# Patient Record
Sex: Male | Born: 1960 | Race: White | Hispanic: No | Marital: Single
Health system: Southern US, Community
[De-identification: ages and names within clinical notes are randomized; demographics above are authoritative.]

## PROBLEM LIST (undated history)

## (undated) DIAGNOSIS — Z9889 Other specified postprocedural states: Secondary | ICD-10-CM

## (undated) DIAGNOSIS — T304 Corrosion of unspecified body region, unspecified degree: Secondary | ICD-10-CM

## (undated) DIAGNOSIS — I10 Essential (primary) hypertension: Secondary | ICD-10-CM

---

## 2003-10-11 ENCOUNTER — Emergency Department (HOSPITAL_COMMUNITY): Admission: EM | Admit: 2003-10-11 | Discharge: 2003-10-11 | Payer: Self-pay | Admitting: Emergency Medicine

## 2004-11-16 ENCOUNTER — Emergency Department (HOSPITAL_COMMUNITY): Admission: EM | Admit: 2004-11-16 | Discharge: 2004-11-16 | Payer: Self-pay | Admitting: Emergency Medicine

## 2006-06-15 ENCOUNTER — Emergency Department: Payer: Self-pay | Admitting: Emergency Medicine

## 2006-07-13 ENCOUNTER — Emergency Department (HOSPITAL_COMMUNITY): Admission: EM | Admit: 2006-07-13 | Discharge: 2006-07-13 | Payer: Self-pay | Admitting: Emergency Medicine

## 2006-12-12 ENCOUNTER — Ambulatory Visit (HOSPITAL_COMMUNITY): Admission: RE | Admit: 2006-12-12 | Discharge: 2006-12-13 | Payer: Self-pay | Admitting: Orthopaedic Surgery

## 2007-04-14 ENCOUNTER — Ambulatory Visit (HOSPITAL_COMMUNITY): Admission: RE | Admit: 2007-04-14 | Discharge: 2007-04-14 | Payer: Self-pay | Admitting: Orthopaedic Surgery

## 2007-08-06 ENCOUNTER — Emergency Department (HOSPITAL_COMMUNITY): Admission: EM | Admit: 2007-08-06 | Discharge: 2007-08-06 | Payer: Self-pay | Admitting: Emergency Medicine

## 2008-12-23 ENCOUNTER — Inpatient Hospital Stay (HOSPITAL_COMMUNITY): Admission: EM | Admit: 2008-12-23 | Discharge: 2008-12-25 | Payer: Self-pay | Admitting: Emergency Medicine

## 2011-03-29 LAB — DIFFERENTIAL
Basophils Absolute: 0.1 10*3/uL (ref 0.0–0.1)
Basophils Relative: 1 % (ref 0–1)
Eosinophils Absolute: 0.1 10*3/uL (ref 0.0–0.7)
Eosinophils Relative: 1 % (ref 0–5)
Lymphs Abs: 1.9 10*3/uL (ref 0.7–4.0)
Monocytes Absolute: 1.2 10*3/uL — ABNORMAL HIGH (ref 0.1–1.0)
Monocytes Relative: 7 % (ref 3–12)
Neutro Abs: 14.6 10*3/uL — ABNORMAL HIGH (ref 1.7–7.7)
Neutrophils Relative %: 82 % — ABNORMAL HIGH (ref 43–77)

## 2011-03-29 LAB — CULTURE, ROUTINE-ABSCESS

## 2011-03-29 LAB — ANAEROBIC CULTURE

## 2011-03-29 LAB — CBC
HCT: 41.4 % (ref 39.0–52.0)
MCHC: 33.5 g/dL (ref 30.0–36.0)
WBC: 17.9 10*3/uL — ABNORMAL HIGH (ref 4.0–10.5)

## 2011-03-29 LAB — CULTURE, BLOOD (ROUTINE X 2)
Culture: NO GROWTH
Culture: NO GROWTH

## 2011-03-29 LAB — POCT I-STAT, CHEM 8
Chloride: 104 mEq/L (ref 96–112)
Hemoglobin: 14.6 g/dL (ref 13.0–17.0)

## 2011-04-27 NOTE — Op Note (Signed)
NAMEMASSEY, RUHLAND                  ACCOUNT NO.:  [id number]   MEDICAL RECORD NO.:  [id number]          PATIENT TYPE:  INP   LOCATION:  5127                         FACILITY:  [facility]   PHYSICIAN:  Tennis Must Meyerdierks, M.D.DATE OF BIRTH:  05-Apr-1961   DATE OF PROCEDURE:  12/23/2008  DATE OF DISCHARGE:                               OPERATIVE REPORT   PREOPERATIVE DIAGNOSES:  Abscess of right long finger with flexor sheath  infection.   POSTOPERATIVE DIAGNOSES:  Abscess of right long finger with flexor  sheath infection with extensor sheath infection.   PROCEDURE:  Incision and drainage of right long finger, flexor and  extensor sheath.   SURGEON:  Lowell Bouton, MD   ANESTHESIA:  General.   OPERATIVE FINDINGS:  The patient had an abscess over the radial aspect  of the proximal phalanx of the right long finger.  There was ecchymosis  dorsally and there was thick purulent material, mainly involving the  extensor sheath.  The flexor sheath had increased fluid, but no gross  purulence.   PROCEDURE:  Under general anesthesia with a tourniquet on the right arm,  the right hand was prepped and draped in usual fashion and after  elevating the limb, the tourniquet was inflated to 250 mmHg.  A mid  lateral incision was made over the proximal phalanx of the right long  finger and carried down through the subcutaneous tissues.  Gross  purulent material was obtained and cultures were sent to the lab.  Blunt  dissection was carried to the extensor sheath and there was purulent  material all along the proximal phalanx.  This was debrided with a  rongeur.  The flexor sheath was then opened over proximal to the A3  pulley and there was increased fluid, but no gross purulent material.  The wound was copiously irrigated with saline.  Marcaine 0.5% digital  block was inserted for pain control.  The wound was packed open and  sterile dressings were applied.  The tourniquet was  released with good  circulation of the hand.  The patient was placed in a dorsal protective  splint.  He tolerated the procedure well and went to the recovery room  awake in stable and good condition.      Lowell Bouton, M.D.  Electronically Signed     EMM/MEDQ  D:  12/23/2008  T:  12/23/2008  Job:  [id number]

## 2011-04-30 NOTE — Op Note (Signed)
Henry Harris, Henry Harris                  ACCOUNT NO.:  [id number]   MEDICAL RECORD NO.:  [id number]          PATIENT TYPE:  OIB   LOCATION:  2550                         FACILITY:  [facility]   PHYSICIAN:  Mark C. Ophelia Charter, M.D.    DATE OF BIRTH:  1961-01-22   DATE OF PROCEDURE:  04/14/2007  DATE OF DISCHARGE:                               OPERATIVE REPORT   PREOPERATIVE DIAGNOSIS:  Nonunion, status post distal radius fracture  with previous osteotomy, bone grafting, volar plate and iliac crest  aspirate.   POSTOPERATIVE DIAGNOSIS:  Nonunion, status post distal radius fracture  with previous osteotomy, bone grafting, volar plate and iliac crest  aspirate.   PROCEDURE:  Left distal radius takedown, nonunion, right iliac crest  bone graft and OP-1 bone morphogenic protein with dorsal locking  compression plate.   This 50 year old male had an on-the-job injury with a distal radius  fracture.  Sought treatment late.  Healed with dorsal angulation and  underwent a corrective osteotomy with volar plating and bone-grafting.  Plate and screws have stayed in good position.  Unfortunately, with time  the bone graft has gradually dissolved and resorbed leaving an empty  space with only a small amount of remaining bone.  He is brought in at  this time for repeat grafting with the dorsal approach and plating.   SURGEON:  Mark C. Ophelia Charter, MD   ASSISTANT:  Maud Deed, PA-C.   ANESTHESIA:  General.   TOURNIQUET TIME:  1 hour 9 minutes.   PROCEDURE:  After shaving the wrist, preoperative Ancef prophylaxis,  standard prepping, time-out procedure was taken.  An S-shaped incision  was made over the dorsal wrist and the interval between the third and  fourth compartment was used for exposure of the distal radius.  Dissection started proximally and subperiosteally and extended distally,  exposing the distal aspect of the radius toward the ulnar aspect,  staying to the ulnar side of Lister tubercle.  The  baby Hohmann  retractors were placed.  The nonunion site was identified.  Soft fibrous  tissue was present and this was the curetted.  Cultures were obtained.  There was no evidence of infection.  Bone was cleaned meticulously with  serial curettes, checking intermittently under fluoroscopy to make sure  that progression had taken place all the way out to the opposite cortex.  A tiny curette off the hand set was used to penetrate the volar cortex  and was checked under fluoroscopy, confirming that it was up against the  plate.  Continued undermining toward the radial aspect was performed,  checking it intermittently until the nonunion site had been completely  cleaned out.  At this point the right iliac crest that had been also  prepped and had a Betadine Vi-Drape had an incision made/  fascia was  split in line with the fibers of the muscle.  Muscle was split with the  Cobb and a 12 mm plug was harvested, taking the dowel plug out and  placing a sponge.  Cancellous chips were taken out and mixed with some  OP-1 powder mixed  with some sterile injectable saline.  This was mixed  together so that it would stick the patient's own bone and bone graft  was packed meticulously into the dry field that had had some holes  drilled in the endplate on each end for bleeding surface.  Bone was  meticulously placed.  Care was taken to make sure it stayed in the  nonunion site without spillage.  It was gradually packed until the site  was completely full.  A Synthes dorsal anatomical locking plate, which  was an L-shaped plate, was then placed.  Two distal screws were placed,  locking, as well as a proximal screw, locking, and checked.  Distal  screws were 18 mm, all the rest of the screws were 20 mm.  One screw  hole was left empty since it was at the osteotomy site where bone graft  had been packed and would not allow purchase.  Tourniquet was deflated  and the dorsal retinaculum was meticulously  repaired.  EPL tendon was  still active, pulled on, and the thumb was moved to make sure it still  slid nicely around Lister tubercle and there was no adherence.  Extensor  tendons to the fingers were good position.  Subcutaneous tissue was  repaired with 2-0 Vicryl, 3-0 Vicryl and then a 4-0 Vicryl subcuticular  closure, tincture of Benzoin, Steri-Strips, Marcaine infiltration,  Marcaine placed in the hip iliac crest wound.  Iliac crest closed with 0  Vicryl, 2-0 Vicryl and skin subcuticular closure.  A postop dressing was  applied.  Arm was replaced back into his wrist splint after tweeners,  4x4s, Webril and Ace wrap.  Instrument count and needle count was  correct.      Mark C. Ophelia Charter, M.D.  Electronically Signed     MCY/MEDQ  D:  04/14/2007  T:  04/14/2007  Job:  [id number]
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
## 2014-06-06 ENCOUNTER — Emergency Department (HOSPITAL_COMMUNITY)
Admission: EM | Admit: 2014-06-06 | Discharge: 2014-06-06 | Disposition: A | Discharge status: Home / Self Care | Payer: Self-pay | Attending: Emergency Medicine | Admitting: Emergency Medicine

## 2014-06-06 ENCOUNTER — Encounter (HOSPITAL_COMMUNITY): Payer: Self-pay | Admitting: Emergency Medicine

## 2014-06-06 DIAGNOSIS — H11421 Conjunctival edema, right eye: Secondary | ICD-10-CM

## 2014-06-06 DIAGNOSIS — Z87828 Personal history of other (healed) physical injury and trauma: Secondary | ICD-10-CM | POA: Insufficient documentation

## 2014-06-06 DIAGNOSIS — H579 Unspecified disorder of eye and adnexa: Secondary | ICD-10-CM

## 2014-06-06 DIAGNOSIS — H11429 Conjunctival edema, unspecified eye: Secondary | ICD-10-CM | POA: Insufficient documentation

## 2014-06-06 DIAGNOSIS — F17200 Nicotine dependence, unspecified, uncomplicated: Secondary | ICD-10-CM | POA: Insufficient documentation

## 2014-06-06 DIAGNOSIS — Z9889 Other specified postprocedural states: Secondary | ICD-10-CM | POA: Insufficient documentation

## 2014-06-06 HISTORY — DX: Other specified postprocedural states: Z98.890

## 2014-06-06 HISTORY — DX: Corrosion of unspecified body region, unspecified degree: T30.4

## 2014-06-06 MED ORDER — ERYTHROMYCIN 5 MG/GM OP OINT: TOPICAL_OINTMENT | OPHTHALMIC | Status: DC

## 2014-06-06 MED ORDER — TETRACAINE HCL 0.5 % OP SOLN
2.0000 [drp] | Freq: Once | OPHTHALMIC | Status: AC
  Administered 2014-06-06: 2 [drp] via OPHTHALMIC
  Filled 2014-06-06: qty 2

## 2014-06-06 MED ORDER — ERYTHROMYCIN 5 MG/GM OP OINT
TOPICAL_OINTMENT | Freq: Once | OPHTHALMIC | Status: AC
  Administered 2014-06-06: 21:00:00 via OPHTHALMIC
  Filled 2014-06-06: qty 1

## 2014-06-06 MED ORDER — FLUORESCEIN SODIUM 1 MG OP STRP
1.0000 | ORAL_STRIP | Freq: Once | OPHTHALMIC | Status: AC
  Administered 2014-06-06: 1 via OPHTHALMIC
  Filled 2014-06-06: qty 1

## 2014-06-06 NOTE — ED Notes (Signed)
Morgan Lens placed in pts rt eye and irrigated.
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
## 2014-06-06 NOTE — ED Notes (Signed)
Declined W/C at D/C and was escorted to lobby by RN. 

## 2014-06-06 NOTE — Discharge Instructions (Signed)
Please call and set up an appointment with ophthalmology tomorrow, Dr. Karleen HampshireSpencer Please use antibiotics as prescribed Please apply cool compressions to the eye Please wear sunglasses for I coverage and protection Please continue to monitor symptoms closely and if symptoms are to worsen or change (fever greater than 101, chills, swelling, numbness, tingling, neck pain, neck stiffness, sudden loss of vision to the eye, worsening or changes to pain symptoms) please report back to the ED immediately  Eye, Foreign Body The term foreign body refers to any object near, on the surface of or in the eye that should not be there. A foreign body may be a small speck of dirt or dust, a hair or eyelash, a splinter or any object. CAUSES  Foreign bodies can get in the eye by:  Flying pieces of something that was broken or destroyed (debris).  A sudden injury (trauma) to the eye. SYMPTOMS  Symptoms depend on what the foreign body is and where it is in the eye. The most common locations are:  On the inner surface of the upper or lower eyelids or on the covering of the white part of the eye (conjunctiva). Symptoms in this location are:  Irritating and painful, especially when blinking.  Feeling like something is in the eye.  On the surface of the clear covering on the front of the eye (cornea). A corneal foreign body has symptoms that:  Are painful and irritating since the cornea is very sensitive.  Form small "rust rings" around a metallic foreign body. Metallic foreign bodies stick more firmly to the surface of the cornea.  Inside the eyeball. Infection can happen fast and can be hard to treat with antibiotics. This is an extremely dangerous situation. Foreign bodies inside the eye can threaten vision. A person may even loose their eye. Foreign bodies inside the eye may cause:  Great pain.  Immediate loss of vision. DIAGNOSIS  Foreign bodies are found during an exam by an eye specialist. Those that  are on the eyelids, conjunctiva or cornea are usually (but not always) easily found. When a foreign body is inside the eyeball, a cataract may form almost right away. This makes it hard for an ophthalmologist to find the foreign body. Special tests may be needed, including ultrasound testing, X-rays and CT scans. TREATMENT   Foreign bodies that are on the eyelids, conjunctiva or cornea are often removed easily and painlessly.  If the foreign body has caused a scratch or abrasion of the cornea, antibiotic drops, ointments and/or a tight patch called a "pressure patch" may be needed. Follow-up exams will be needed for several days until the abrasion heals.  Surgery is needed right away if the foreign body is inside the eyeball. This is a medical emergency. An antibiotic therapy will likely be given to stop an infection. HOME CARE INSTRUCTIONS  The use of eye patches is not universal. Their use varies from state to state and from caregiver to caregiver. If an eye patch was applied:  Keep the eye patch on for as long as directed by your caregiver until the follow-up appointment.  Do not remove the patch to put in medications unless instructed to do so. When replacing the patch, retape it as it was before. Follow the same procedure if the patch becomes loose.  WARNING: Do not drive or operate machinery while the eye is patched. The ability to judge distances will be impaired.  Only take over-the-counter or prescription medicines for pain, discomfort or fever as  directed by the caregiver. If no eye patch was applied:  Keep the eye closed as much as possible. Do not rub the eye.  Wear dark glasses as needed to protect the eyes from bright light.  Do not wear contact lenses until the eye feels normal again, or as instructed.  Wear protective eye covering if there is a risk of eye injury. This is important when working with high speed tools.  Only take over-the-counter or prescription medicines  for pain, discomfort or fever as directed by the caregiver. SEEK IMMEDIATE MEDICAL CARE IF:   Pain increases in the eye or the vision changes.  You or your child has problems with the eye patch.  The injury to the eye appears to be getting larger.  There is discharge from the injured eye.  Swelling and/or soreness (inflammation) develops around the affected eye.  You or your child has an oral temperature above 102 F (38.9 C), not controlled by medicine.  Your baby is older than 3 months with a rectal temperature of 102 F (38.9 C) or higher.  Your baby is 27 months old or younger with a rectal temperature of 100.4 F (38 C) or higher. MAKE SURE YOU:   Understand these instructions.  Will watch your condition.  Will get help right away if you are not doing well or get worse. Document Released: 11/29/2005 Document Revised: 02/21/2012 Document Reviewed: 04/26/2013 [facility] Patient Information 2015 [place], [place]. This information is not intended to replace advice given to you by your health care provider. Make sure you discuss any questions you have with your health care provider.   Emergency Department Resource Guide 1) Find a Doctor and Pay Out of Pocket Although you won't have to find out who is covered by your insurance plan, it is a good idea to ask around and get recommendations. You will then need to call the office and see if the doctor you have chosen will accept you as a new patient and what types of options they offer for patients who are self-pay. Some doctors offer discounts or will set up payment plans for their patients who do not have insurance, but you will need to ask so you aren't surprised when you get to your appointment.  2) Contact Your Local Health Department Not all health departments have doctors that can see patients for sick visits, but many do, so it is worth a call to see if yours does. If you don't know where your local health department is, you can  check in your phone book. The CDC also has a tool to help you locate your state's health department, and many state websites also have listings of all of their local health departments.  3) Find a Walk-in Clinic If your illness is not likely to be very severe or complicated, you may want to try a walk in clinic. These are popping up all over the country in pharmacies, drugstores, and shopping centers. They're usually staffed by nurse practitioners or physician assistants that have been trained to treat common illnesses and complaints. They're usually fairly quick and inexpensive. However, if you have serious medical issues or chronic medical problems, these are probably not your best option.  No Primary Care Doctor: - Call Health Connect at  [phone] - they can help you locate a primary care doctor that  accepts your insurance, provides certain services, etc. - Physician Referral Service- [phone]  Chronic Pain Problems: Organization         Address  Phone   Notes  [facility]  [phone]) [phone] Patients need to be referred by their primary care doctor.   Medication Assistance: Organization         Address  Phone   Notes  [facility] Medication [facility] [address] [place], [place] [address] [phone]) [phone] --Must be a resident of [facility] [place] -- Must have NO insurance coverage whatsoever (no Medicaid/ Medicare, etc.) -- The pt. MUST have a primary care doctor that directs their care regularly and follows them in the community   MedAssist  [phone]   [employer]  [phone]) [phone]    Agencies that provide inexpensive medical care: Organization         Address  Phone   Notes  [facility]  [phone]) [phone]   [facility]    [phone]) [phone]   [facility] [address] [place], [place] [address] [phone]) [phone]   [facility] [address], [place] [phone]) [phone]    [facility]anned Parenthood    [phone](336) [phone]   [facility]    [phone]   [facility]  [address], [place] Phone:  [phone], Fax:  [phone] Hours of Operation:  9 am - 6 pm, M-F.  Also accepts Medicaid/Medicare and self-pay.  [facility]  [address], [place] Phone: [phone]) [phone], Fax: [phone]. Hours of Operation:  8:30 am - 5:30 pm, M-F.  Also accepts Medicaid and self-pay.  [facility] [place] [address], [place] Phone: [phone]) [phone]   [facility] [address], [place], [place] [phone], Ext. [phone] Mondays & Thursdays: 7-9 AM.  First 15 patients are seen on a first come, first serve basis.    Medicaid-accepting [facility] Providers:  Organization         Address  Phone   Notes  [facility] [address], [place] [phone] Also accepts self-pay patients.  [facility] [address], [place]  [phone]) [phone]   [facility] [address], [place] [phone]) [phone]   [facility] [address] [place] [address], [place] [phone]) [phone]   Renaye RakersVeita Bland [address], [place]   [phone]) [phone] Only accepts [place] Access [place] patients after they have their name applied to their card.   Self-Pay (no insurance) in [facility]:  Organization         Address  Phone   Notes  Sickle Cell Patients, [facility] [address], [place] [phone]) [phone]   [facility] [address], [place] [phone]) [phone]   [facility] [place]  [address],  [phone]) [phone]   [facility]/Dr. Osei-Bonsu  [address] [place] [address], [place] or [phone], [place] [phone] Phone number for both [place] and [place] locations is the  same.  [facility] [address], [place] [phone](336) [phone]   [facility] [place] [address] [place] [address], [place] or [address] [phone]) [phone]   [facility] [address], [place] [phone], phone; [phone]) [phone], fax Sees patients 1st and 3rd Saturday of every month.  Must not qualify for public  or private insurance (i.e. Medicaid, Medicare, [place] Health Choice, Veterans' Benefits)  Household income should be no more than 200% of the poverty level The clinic cannot treat you if you are pregnant or think you are pregnant  Sexually transmitted diseases are not treated at the clinic.    Dental Care: Organization         Address  Phone  Notes  [facility] of [facility] [address] [place], [place] [phone] Accepts children up to age 66 who are enrolled in [place] or [place] Health Choice; pregnant women with a Medicaid card; and children who have applied for Medicaid or [place] Health Choice, but were declined, whose parents can pay a reduced fee at time of service.  [facility] of [facility]  [address], [place] [phone] Accepts children up to age 49 who are enrolled in [place] or [place] Health Choice; pregnant women with a Medicaid card; and children who have applied for Medicaid or [place] Health Choice, but were declined, whose parents can pay a reduced fee at time of service.  [facility]  [address] [place], [place] [phone] Patients are seen by appointment only. Walk-ins are not accepted. [facility] will see patients 3 years of age and older. Monday - Tuesday (8am-5pm) Most Wednesdays (8:30-5pm) $30 per visit, cash only  [facility] Adult Dental Access PROGRAM  [address], [facility] [phone] Patients are seen by appointment only. Walk-ins are not accepted. [facility] will see patients  45 years of age and older. One Wednesday Evening (Monthly: Volunteer Based).  $30 per visit, cash only  [employer]  [phone] for adults; Children under age 59, call Graduate Pediatric Dentistry at [phone]. Children aged 8-14, please call [phone] to request a pediatric application.  Dental services are provided in all areas of dental care including fillings, crowns and bridges, complete and partial dentures, implants, gum treatment, root canals, and extractions. Preventive care is also provided. Treatment is provided to both adults and children. Patients are selected via a lottery and there is often a waiting list.   [facility] [address], [place]  [phone] [email or web address]   [facility] [address] [place], [place] [phone], Ext. [phone] Second and Fourth Thursday of each month, opens at 6:30 AM; Clinic ends at 9 AM.  Patients are seen on a first-come first-served basis, and a limited number are seen during each clinic.   [facility]  [address] [place], [place] [phone]   Eligibility Requirements You must have lived in [place], [place], or [place] counties for at least the last three months.   You cannot be eligible for state or federal sponsored [place], including [employer], [place], or [employer].   You generally cannot be eligible for healthcare insurance through your employer.    How to apply: Eligibility screenings are held every Tuesday and Wednesday afternoon from 1:00 pm until 4:00 pm. You do not need an appointment for the interview!  [facility] [address], [place], [place] [phone]   [facility] Health Department  [phone]   [facility] Health Department  [phone]   [facility] Health Department  [phone]    Behavioral Health Resources in the Community: Intensive Outpatient  Programs Organization         Address  Phone  Notes  [facility] [address], [place], [place] [phone]   [facility] Outpatient [address] [place], [place] [phone]   [facility] [address], [place], [place]  [phone]   [facility] [address]  [place], [place] [phone] or [phone]   Substance Abuse Resources Organization         Address  Phone  Notes  Al[facility] Drug Services  [phone]   [facility]  [phone]   [facility] [place]  [phone]   Floydene Flock  [phone]   Residential & Outpatient Substance Abuse Program  [phone]   Psychological Services Organization         Address  Phone  Notes  [facility]  [phone]   [facility] Services  [phone]   [facility] Mental Health [address] [place] [phone] or [phone]    Mobile Crisis Teams Organization         Address  Phone  Notes  [facility], Mobile Crisis Care Unit  [phone]   [facility]  [address] [place], [place] [phone]   Doristine Locks [address] [place] [phone]    Self-Help/Support Groups Organization         Address  Phone             Notes  [facility] [place] - variety of support groups  336- I7437963 Call for more information  Narcotics Anonymous (NA), Caring [facility] [address], [employer] [place]  2 meetings at this location   Statistician         Address  Phone  Notes  [facility] [address],    [place]  [phone]   [facility]  [address] [place] [address], [place], [place] [phone]   [facility] [address] [place], [place] [phone] Admissions: 8am-3pm M-F  [facility] [address]    [place], [place]  [phone]   [facility] [address] [place], [place], [place] [phone]   [facility] [address]  [place], [place] [phone]   [facility] - Intensive Outpatient [address], [place], [place] [phone]   [facility] ([facility]iction [facility] [address]  [place], [place] [phone] or [phone]   [facility] ([facility]) [address] [place], [place] [phone] Accepts Medicaid  [facility] [employer] [address]  [place] [phone] Substance Abuse/Addiction Treatment   [facility] Organization         Address  Phone  Notes  [facility]  [phone]   Angie Fava, PhD [address] [place], [place]   [phone] or [phone]   [facility]al   [address] [place], [place] [phone]   [facility] [address] [place], [place] [phone] Insurance/Medicaid/sponsorship through [facility] [address]                                    [place], [place] [phone] Therapy/tele-psych/case  [facility] [address], [place] [phone]    Dr. Lolly Mustache  [phone]   [facility] [place]  [employer] [facility]  Dept. 1) [address], [place] 2) [address], [place] 3)  [address], [place] [phone](336) [phone]) [phone]  [phone]) [phone]   [facility] Child Abuse Hotline [phone]) [phone] or [phone](336) [phone] (After Hours)

## 2014-06-06 NOTE — ED Notes (Signed)
Irrigation completed. 
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
## 2014-06-07 NOTE — ED Provider Notes (Signed)
Medical screening examination/treatment/procedure(s) were performed by non-physician practitioner and as supervising physician I was immediately available for consultation/collaboration.   EKG Interpretation None        Gwyneth SproutWhitney Plunkett, MD 06/07/14 1359

## 2014-06-09 ENCOUNTER — Telehealth (HOSPITAL_BASED_OUTPATIENT_CLINIC_OR_DEPARTMENT_OTHER): Payer: Self-pay | Admitting: Emergency Medicine

## 2014-07-19 ENCOUNTER — Emergency Department (HOSPITAL_COMMUNITY): Payer: Self-pay

## 2014-07-19 ENCOUNTER — Emergency Department (HOSPITAL_COMMUNITY)
Admission: EM | Admit: 2014-07-19 | Discharge: 2014-07-19 | Disposition: A | Discharge status: Home / Self Care | Payer: Self-pay | Attending: Emergency Medicine | Admitting: Emergency Medicine

## 2014-07-19 ENCOUNTER — Encounter (HOSPITAL_COMMUNITY): Payer: Self-pay | Admitting: Emergency Medicine

## 2014-07-19 DIAGNOSIS — F17200 Nicotine dependence, unspecified, uncomplicated: Secondary | ICD-10-CM | POA: Insufficient documentation

## 2014-07-19 DIAGNOSIS — Z23 Encounter for immunization: Secondary | ICD-10-CM | POA: Insufficient documentation

## 2014-07-19 DIAGNOSIS — W208XXA Other cause of strike by thrown, projected or falling object, initial encounter: Secondary | ICD-10-CM | POA: Insufficient documentation

## 2014-07-19 DIAGNOSIS — S99919A Unspecified injury of unspecified ankle, initial encounter: Secondary | ICD-10-CM

## 2014-07-19 DIAGNOSIS — Z7982 Long term (current) use of aspirin: Secondary | ICD-10-CM | POA: Insufficient documentation

## 2014-07-19 DIAGNOSIS — S92309A Fracture of unspecified metatarsal bone(s), unspecified foot, initial encounter for closed fracture: Secondary | ICD-10-CM | POA: Insufficient documentation

## 2014-07-19 DIAGNOSIS — Y9289 Other specified places as the place of occurrence of the external cause: Secondary | ICD-10-CM | POA: Insufficient documentation

## 2014-07-19 DIAGNOSIS — S8990XA Unspecified injury of unspecified lower leg, initial encounter: Secondary | ICD-10-CM | POA: Insufficient documentation

## 2014-07-19 DIAGNOSIS — S92301A Fracture of unspecified metatarsal bone(s), right foot, initial encounter for closed fracture: Secondary | ICD-10-CM

## 2014-07-19 DIAGNOSIS — S99929A Unspecified injury of unspecified foot, initial encounter: Secondary | ICD-10-CM

## 2014-07-19 DIAGNOSIS — Y9389 Activity, other specified: Secondary | ICD-10-CM | POA: Insufficient documentation

## 2014-07-19 MED ORDER — TETANUS-DIPHTH-ACELL PERTUSSIS 5-2.5-18.5 LF-MCG/0.5 IM SUSP
0.5000 mL | Freq: Once | INTRAMUSCULAR | Status: AC
  Administered 2014-07-19: 0.5 mL via INTRAMUSCULAR
  Filled 2014-07-19: qty 0.5

## 2014-07-19 MED ORDER — CLINDAMYCIN HCL 300 MG PO CAPS: 300.0000 mg | ORAL_CAPSULE | Freq: Four times a day (QID) | ORAL | Status: DC

## 2014-07-19 NOTE — Progress Notes (Signed)
Orthopedic Tech Progress Note Patient Details:  Henry Harris 01/16/1961 [id number] Post SLS applied to RLE, crutches and training provided Ortho Devices Type of Ortho Device: Ace wrap;Crutches;Post (short leg) splint Ortho Device/Splint Location: RLE Ortho Device/Splint Interventions: Application   Asia R Thompson 07/19/2014, 11:31 AM

## 2014-07-19 NOTE — ED Notes (Signed)
Ortho at the bedside.

## 2014-07-19 NOTE — ED Notes (Signed)
Ortho paged for splint and crutches 

## 2014-07-19 NOTE — Discharge Instructions (Signed)
Metatarsal Fracture, Undisplaced  A metatarsal fracture is a break in the bone(s) of the foot. These are the bones of the foot that connect your toes to the bones of the ankle.  DIAGNOSIS   The diagnoses of these fractures are usually made with X-rays. If there are problems in the forefoot and x-rays are normal a later bone scan will usually make the diagnosis.   TREATMENT AND HOME CARE INSTRUCTIONS  · Treatment may or may not include a cast or walking shoe. When casts are needed the use is usually for short periods of time so as not to slow down healing with muscle wasting (atrophy).  · Activities should be stopped until further advised by your caregiver.  · Wear shoes with adequate shock absorbing capabilities and stiff soles.  · Alternative exercise may be undertaken while waiting for healing. These may include bicycling and swimming, or as your caregiver suggests.  · It is important to keep all follow-up visits or specialty referrals. The failure to keep these appointments could result in improper bone healing and chronic pain or disability.  · Warning: Do not drive a car or operate a motor vehicle until your caregiver specifically tells you it is safe to do so.  IF YOU DO NOT HAVE A CAST OR SPLINT:  · You may walk on your injured foot as tolerated or advised.  · Do not put any weight on your injured foot for as long as directed by your caregiver. Slowly increase the amount of time you walk on the foot as the pain allows or as advised.  · Use crutches until you can bear weight without pain. A gradual increase in weight bearing may help.  · Apply ice to the injury for 15-20 minutes each hour while awake for the first 2 days. Put the ice in a plastic bag and place a towel between the bag of ice and your skin.  · Only take over-the-counter or prescription medicines for pain, discomfort, or fever as directed by your caregiver.  SEEK IMMEDIATE MEDICAL CARE IF:   · Your cast gets damaged or breaks.  · You have  continued severe pain or more swelling than you did before the cast was put on, or the pain is not controlled with medications.  · Your skin or nails below the injury turn blue or grey, or feel cold or numb.  · There is a bad smell, or new stains or pus-like (purulent) drainage coming from the cast.  MAKE SURE YOU:   · Understand these instructions.  · Will watch your condition.  · Will get help right away if you are not doing well or get worse.  Document Released: 08/21/2002 Document Revised: 02/21/2012 Document Reviewed: 07/12/2008  ExitCare® Patient Information ©2015 [employer]. This information is not intended to replace advice given to you by your health care provider. Make sure you discuss any questions you have with your health care provider.
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
## 2016-02-05 ENCOUNTER — Encounter (HOSPITAL_COMMUNITY): Payer: Self-pay | Admitting: Emergency Medicine

## 2016-02-05 ENCOUNTER — Emergency Department (HOSPITAL_COMMUNITY)
Admission: EM | Admit: 2016-02-05 | Discharge: 2016-02-05 | Disposition: A | Discharge status: Home / Self Care | Payer: Self-pay | Attending: Emergency Medicine | Admitting: Emergency Medicine

## 2016-02-05 ENCOUNTER — Emergency Department (HOSPITAL_COMMUNITY): Payer: Self-pay

## 2016-02-05 DIAGNOSIS — Z87828 Personal history of other (healed) physical injury and trauma: Secondary | ICD-10-CM | POA: Insufficient documentation

## 2016-02-05 DIAGNOSIS — R55 Syncope and collapse: Secondary | ICD-10-CM

## 2016-02-05 DIAGNOSIS — I959 Hypotension, unspecified: Secondary | ICD-10-CM

## 2016-02-05 DIAGNOSIS — D72829 Elevated white blood cell count, unspecified: Secondary | ICD-10-CM

## 2016-02-05 DIAGNOSIS — F17210 Nicotine dependence, cigarettes, uncomplicated: Secondary | ICD-10-CM | POA: Insufficient documentation

## 2016-02-05 LAB — BASIC METABOLIC PANEL
Anion gap: 7 (ref 5–15)
BUN: 8 mg/dL (ref 6–20)
CHLORIDE: 106 mmol/L (ref 101–111)
CO2: 24 mmol/L (ref 22–32)
CREATININE: 1.03 mg/dL (ref 0.61–1.24)
Calcium: 8.7 mg/dL — ABNORMAL LOW (ref 8.9–10.3)
GFR calc Af Amer: >60 mL/min (ref >60)
GFR calc non Af Amer: >60 mL/min (ref >60)
GLUCOSE: 132 mg/dL — AB (ref 65–99)
POTASSIUM: 4.1 mmol/L (ref 3.5–5.1)
Sodium: 137 mmol/L (ref 135–145)

## 2016-02-05 LAB — CBC WITH DIFFERENTIAL/PLATELET
Basophils Absolute: 0 K/uL (ref 0.0–0.1)
Basophils Relative: 0 %
Eosinophils Absolute: 0 K/uL (ref 0.0–0.7)
Eosinophils Relative: 0 %
HCT: 38.7 % — ABNORMAL LOW (ref 39.0–52.0)
Hemoglobin: 13 g/dL (ref 13.0–17.0)
Lymphocytes Relative: 7 %
Lymphs Abs: 1.4 K/uL (ref 0.7–4.0)
MCH: 34.4 pg — ABNORMAL HIGH (ref 26.0–34.0)
MCHC: 33.6 g/dL (ref 30.0–36.0)
MCV: 102.4 fL — ABNORMAL HIGH (ref 78.0–100.0)
Monocytes Absolute: 1.2 K/uL — ABNORMAL HIGH (ref 0.1–1.0)
Monocytes Relative: 6 %
Neutro Abs: 17.3 K/uL — ABNORMAL HIGH (ref 1.7–7.7)
Neutrophils Relative %: 87 %
Platelets: 311 K/uL (ref 150–400)
RBC: 3.78 MIL/uL — ABNORMAL LOW (ref 4.22–5.81)
RDW: 12.9 % (ref 11.5–15.5)
WBC: 19.9 K/uL — ABNORMAL HIGH (ref 4.0–10.5)

## 2016-02-05 LAB — URINALYSIS, ROUTINE W REFLEX MICROSCOPIC
BILIRUBIN URINE: NEGATIVE
GLUCOSE, UA: NEGATIVE mg/dL
HGB URINE DIPSTICK: NEGATIVE
Ketones, ur: NEGATIVE mg/dL
Leukocytes, UA: NEGATIVE
NITRITE: NEGATIVE
PH: 6.5 (ref 5.0–8.0)
Protein, ur: NEGATIVE mg/dL
SPECIFIC GRAVITY, URINE: 1.017 (ref 1.005–1.030)

## 2016-02-05 LAB — I-STAT TROPONIN, ED: Troponin i, poc: 0 ng/mL (ref 0.00–0.08)

## 2016-02-05 MED ORDER — SODIUM CHLORIDE 0.9 % IV BOLUS (SEPSIS)
1000.0000 mL | Freq: Once | INTRAVENOUS | Status: AC
  Administered 2016-02-05: 1000 mL via INTRAVENOUS

## 2016-02-05 NOTE — Discharge Instructions (Signed)
Hypotension As your heart beats, it forces blood through your arteries. This force is your blood pressure. If your blood pressure is too low for you to go about your normal activities or to support the organs of your body, you have hypotension. Hypotension is also referred to as low blood pressure. When your blood pressure becomes too low, you may not get enough blood to your brain. As a result, you may feel weak, feel lightheaded, or develop a rapid heart rate. In a more severe case, you may faint. CAUSES Various conditions can cause hypotension. These include:  Blood loss.  Dehydration.  Heart or endocrine problems.  Pregnancy.  Severe infection.  Not having a well-balanced diet filled with needed nutrients.  Severe allergic reactions (anaphylaxis). Some medicines, such as blood pressure medicine or water pills (diuretics), may lower your blood pressure below normal. Sometimes taking too much medicine or taking medicine not as directed can cause hypotension. TREATMENT  Hospitalization is sometimes required for hypotension if fluid or blood replacement is needed, if time is needed for medicines to wear off, or if further monitoring is needed. Treatment might include changing your diet, changing your medicines (including medicines aimed at raising your blood pressure), and use of support stockings. HOME CARE INSTRUCTIONS   Drink enough fluids to keep your urine clear or pale yellow.  Take your medicines as directed by your health care provider.  Get up slowly from reclining or sitting positions. This gives your blood pressure a chance to adjust.  Wear support stockings as directed by your health care provider.  Maintain a healthy diet by including nutritious food, such as fruits, vegetables, nuts, whole grains, and lean meats. SEEK MEDICAL CARE IF:  You have vomiting or diarrhea.  You have a fever for more than 2-3 days.  You feel more thirsty than usual.  You feel weak and  tired. SEEK IMMEDIATE MEDICAL CARE IF:   You have chest pain or a fast or irregular heartbeat.  You have a loss of feeling in some part of your body, or you lose movement in your arms or legs.  You have trouble speaking.  You become sweaty or feel lightheaded.  You faint. MAKE SURE YOU:   Understand these instructions.  Will watch your condition.  Will get help right away if you are not doing well or get worse.   This information is not intended to replace advice given to you by your health care provider. Make sure you discuss any questions you have with your health care provider.   Document Released: 11/29/2005 Document Revised: 09/19/2013 Document Reviewed: 06/01/2013 Elsevier Interactive Patient Education 2016 [employer]  Leukocytosis Leukocytosis means you have more white blood cells than normal. White blood cells are made in your bone marrow. The main job of white blood cells is to fight infection. Having too many white blood cells is a common condition. It can develop as a result of many types of medical problems. CAUSES  In some cases, your bone marrow may be normal, but it is still making too many white blood cells. This could be the result of:  Infection.  Injury.  Physical stress.  Emotional stress.  Surgery.  Allergic reactions.  Tumors that do not start in the blood or bone marrow.  An inherited disease.  Certain medicines.  Pregnancy and labor. In other cases, you may have a bone marrow disorder that is causing your body to make too many white blood cells. Bone marrow disorders include:  Leukemia.  This is a type of blood cancer.  Myeloproliferative disorders. These disorders cause blood cells to grow abnormally. SYMPTOMS  Some people have no symptoms. Others have symptoms due to the medical problem that is causing their leukocytosis. These symptoms may include:  Bleeding.  Bruising.  Fever.  Night sweats.  Repeated  infections.  Weakness.  Weight loss. DIAGNOSIS  Leukocytosis is often found during blood tests that are done as part of a normal physical exam. Your caregiver will probably order other tests to help determine why you have too many white blood cells. These tests may include:  A complete blood count (CBC). This test measures all the types of blood cells in your body.  Chest X-rays, urine tests (urinalysis), or other tests to look for signs of infection.  Bone marrow aspiration. For this test, a needle is put into your bone. Cells from the bone marrow are removed through the needle. The cells are then examined under a microscope. TREATMENT  Treatment is usually not needed for leukocytosis. However, if a disorder is causing your leukocytosis, it will need to be treated. Treatment may include:  Antibiotic medicines if you have a bacterial infection.  Bone marrow transplant. Your diseased bone marrow is replaced with healthy cells that will grow new bone marrow.  Chemotherapy. This is the use of drugs to kill cancer cells. HOME CARE INSTRUCTIONS  Only take over-the-counter or prescription medicines as directed by your caregiver.  Maintain a healthy weight. Ask your caregiver what weight is best for you.  Eat foods that are low in saturated fats and high in fiber. Eat plenty of fruits and vegetables.  Drink enough fluids to keep your urine clear or pale yellow.  Get 30 minutes of exercise at least 5 times a week. Check with your caregiver before starting a new exercise routine.  Limit caffeine and alcohol.  Do not smoke.  Keep all follow-up appointments as directed by your caregiver. SEEK MEDICAL CARE IF:  You feel weak or more tired than usual.  You develop chills, a cough, or nasal congestion.  You lose weight without trying.  You have night sweats.  You bruise easily. SEEK IMMEDIATE MEDICAL CARE IF:  You bleed more than normal.  You have chest pain.  You have  trouble breathing.  You have a fever.  You have uncontrolled nausea or vomiting.  You feel dizzy or lightheaded. MAKE SURE YOU:  Understand these instructions.  Will watch your condition.  Will get help right away if you are not doing well or get worse.   This information is not intended to replace advice given to you by your health care provider. Make sure you discuss any questions you have with your health care provider.   Document Released: 11/18/2011 Document Revised: 02/21/2012 Document Reviewed: 06/02/2015 Elsevier Interactive Patient Education 2016 [employer].  Near-Syncope Near-syncope (commonly known as near fainting) is sudden weakness, dizziness, or feeling like you might pass out. During an episode of near-syncope, you may also develop pale skin, have tunnel vision, or feel sick to your stomach (nauseous). Near-syncope may occur when getting up after sitting or while standing for a long time. It is caused by a sudden decrease in blood flow to the brain. This decrease can result from various causes or triggers, most of which are not serious. However, because near-syncope can sometimes be a sign of something serious, a medical evaluation is required. The specific cause is often not determined. HOME CARE INSTRUCTIONS  Monitor your condition for  any changes. The following actions may help to alleviate any discomfort you are experiencing:  Have someone stay with you until you feel stable.  Lie down right away and prop your feet up if you start feeling like you might faint. Breathe deeply and steadily. Wait until all the symptoms have passed. Most of these episodes last only a few minutes. You may feel tired for several hours.   Drink enough fluids to keep your urine clear or pale yellow.   If you are taking blood pressure or heart medicine, get up slowly when seated or lying down. Take several minutes to sit and then stand. This can reduce dizziness.  Follow up with your  health care provider as directed. SEEK IMMEDIATE MEDICAL CARE IF:   You have a severe headache.   You have unusual pain in the chest, abdomen, or back.   You are bleeding from the mouth or rectum, or you have black or tarry stool.   You have an irregular or very fast heartbeat.   You have repeated fainting or have seizure-like jerking during an episode.   You faint when sitting or lying down.   You have confusion.   You have difficulty walking.   You have severe weakness.   You have vision problems.  MAKE SURE YOU:   Understand these instructions.  Will watch your condition.  Will get help right away if you are not doing well or get worse.   This information is not intended to replace advice given to you by your health care provider. Make sure you discuss any questions you have with your health care provider.   Document Released: 11/29/2005 Document Revised: 12/04/2013 Document Reviewed: 05/04/2013 Elsevier Interactive Patient Education [employer]

## 2016-02-05 NOTE — ED Notes (Addendum)
Per EMS, pt's BP was 62/46, pt c/o weakness. BP now 100/62, has received 500 mL NS and 4 mg zofran en route. Pt states he has been working outside without proper hydration.

## 2016-02-05 NOTE — ED Notes (Signed)
Made my first request for urine,pt unable to provide one at this time
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
## 2016-02-05 NOTE — ED Notes (Signed)
Pt reports near syncopal episode today while in his yard.  States he became really dizzy and got down on the floor.  Pt is A&Ox 4.  Reports mild dizziness but denies any cp or SOBa at this time.

## 2019-11-12 ENCOUNTER — Encounter (HOSPITAL_COMMUNITY): Payer: Self-pay | Admitting: Emergency Medicine

## 2019-11-12 ENCOUNTER — Emergency Department (HOSPITAL_COMMUNITY): Payer: Medicaid Other

## 2019-11-12 ENCOUNTER — Other Ambulatory Visit: Payer: Self-pay

## 2019-11-12 ENCOUNTER — Emergency Department (HOSPITAL_COMMUNITY)
Admission: EM | Admit: 2019-11-12 | Discharge: 2019-11-12 | Disposition: A | Discharge status: Home / Self Care | Payer: Medicaid Other | Attending: Emergency Medicine | Admitting: Emergency Medicine

## 2019-11-12 DIAGNOSIS — R200 Anesthesia of skin: Secondary | ICD-10-CM

## 2019-11-12 DIAGNOSIS — F17210 Nicotine dependence, cigarettes, uncomplicated: Secondary | ICD-10-CM | POA: Diagnosis not present

## 2019-11-12 HISTORY — DX: Essential (primary) hypertension: I10

## 2019-11-12 LAB — COMPREHENSIVE METABOLIC PANEL
ALT: 17 U/L (ref 0–44)
AST: 27 U/L (ref 15–41)
Albumin: 3.5 g/dL (ref 3.5–5.0)
Alkaline Phosphatase: 95 U/L (ref 38–126)
Anion gap: 11 (ref 5–15)
BUN: 9 mg/dL (ref 6–20)
CO2: 24 mmol/L (ref 22–32)
Calcium: 8.9 mg/dL (ref 8.9–10.3)
Chloride: 106 mmol/L (ref 98–111)
Creatinine, Ser: 1.12 mg/dL (ref 0.61–1.24)
GFR calc Af Amer: >60 mL/min (ref >60)
GFR calc non Af Amer: >60 mL/min (ref >60)
Glucose, Bld: 161 mg/dL — ABNORMAL HIGH (ref 70–99)
Potassium: 3.9 mmol/L (ref 3.5–5.1)
Sodium: 141 mmol/L (ref 135–145)
Total Bilirubin: 0.6 mg/dL (ref 0.3–1.2)
Total Protein: 6.4 g/dL — ABNORMAL LOW (ref 6.5–8.1)

## 2019-11-12 LAB — CBC
HCT: 40.3 % (ref 39.0–52.0)
Hemoglobin: 13.5 g/dL (ref 13.0–17.0)
MCH: 35.2 pg — ABNORMAL HIGH (ref 26.0–34.0)
MCHC: 33.5 g/dL (ref 30.0–36.0)
MCV: 105.2 fL — ABNORMAL HIGH (ref 80.0–100.0)
Platelets: 356 10*3/uL (ref 150–400)
RBC: 3.83 MIL/uL — ABNORMAL LOW (ref 4.22–5.81)
RDW: 12.3 % (ref 11.5–15.5)
WBC: 6.4 10*3/uL (ref 4.0–10.5)
nRBC: 0 % (ref 0.0–0.2)

## 2019-11-12 MED ORDER — METHOCARBAMOL 500 MG PO TABS
1000.0000 mg | ORAL_TABLET | Freq: Once | ORAL | Status: AC
  Administered 2019-11-12: 1000 mg via ORAL
  Filled 2019-11-12: qty 2

## 2019-11-12 MED ORDER — PREDNISONE 20 MG PO TABS: 40.0000 mg | ORAL_TABLET | Freq: Every day | ORAL | 0 refills | Status: AC

## 2019-11-12 NOTE — ED Notes (Signed)
Patient transported to CT 

## 2019-11-12 NOTE — ED Triage Notes (Signed)
Pt reports right arm and leg numbness off and on for the last 4-5 years. Reports he's been in the waiting room since 11pm last night. No distress noted. FAROM. VSS. Alert, oriented x4.
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
## 2019-11-12 NOTE — Discharge Instructions (Addendum)
You were evaluated in the Emergency Department and after careful evaluation, we did not find any emergent condition requiring admission or further testing in the hospital.  Your exam/testing today is overall reassuring.  We recommend that you follow-up with the neurologists for further testing.  Please take the prednisone medication as directed to help with your sciatica pain.  Please return to the Emergency Department if you experience any worsening of your condition.  We encourage you to follow up with a primary care provider.  Thank you for allowing [place] to be a part of your care.

## 2021-03-17 ENCOUNTER — Inpatient Hospital Stay (HOSPITAL_COMMUNITY): Payer: Medicaid Other

## 2021-03-17 ENCOUNTER — Other Ambulatory Visit: Payer: Self-pay

## 2021-03-17 ENCOUNTER — Inpatient Hospital Stay (HOSPITAL_COMMUNITY)
Admission: EM | Admit: 2021-03-17 | Discharge: 2021-03-31 | DRG: 871 | Payer: Medicaid Other | Attending: Family Medicine | Admitting: Family Medicine

## 2021-03-17 ENCOUNTER — Encounter (HOSPITAL_COMMUNITY): Payer: Self-pay

## 2021-03-17 ENCOUNTER — Emergency Department (HOSPITAL_COMMUNITY): Payer: Medicaid Other

## 2021-03-17 DIAGNOSIS — E872 Acidosis: Secondary | ICD-10-CM | POA: Diagnosis present

## 2021-03-17 DIAGNOSIS — D638 Anemia in other chronic diseases classified elsewhere: Secondary | ICD-10-CM | POA: Diagnosis present

## 2021-03-17 DIAGNOSIS — Z28310 Unvaccinated for covid-19: Secondary | ICD-10-CM

## 2021-03-17 DIAGNOSIS — Z5900 Homelessness unspecified: Secondary | ICD-10-CM | POA: Diagnosis not present

## 2021-03-17 DIAGNOSIS — K0889 Other specified disorders of teeth and supporting structures: Secondary | ICD-10-CM | POA: Diagnosis not present

## 2021-03-17 DIAGNOSIS — Z681 Body mass index (BMI) 19 or less, adult: Secondary | ICD-10-CM | POA: Diagnosis not present

## 2021-03-17 DIAGNOSIS — D519 Vitamin B12 deficiency anemia, unspecified: Secondary | ICD-10-CM | POA: Diagnosis present

## 2021-03-17 DIAGNOSIS — F17210 Nicotine dependence, cigarettes, uncomplicated: Secondary | ICD-10-CM | POA: Diagnosis present

## 2021-03-17 DIAGNOSIS — D539 Nutritional anemia, unspecified: Secondary | ICD-10-CM | POA: Diagnosis present

## 2021-03-17 DIAGNOSIS — R6520 Severe sepsis without septic shock: Secondary | ICD-10-CM | POA: Diagnosis present

## 2021-03-17 DIAGNOSIS — E860 Dehydration: Secondary | ICD-10-CM | POA: Diagnosis present

## 2021-03-17 DIAGNOSIS — I10 Essential (primary) hypertension: Secondary | ICD-10-CM | POA: Diagnosis present

## 2021-03-17 DIAGNOSIS — D649 Anemia, unspecified: Secondary | ICD-10-CM | POA: Diagnosis not present

## 2021-03-17 DIAGNOSIS — F1410 Cocaine abuse, uncomplicated: Secondary | ICD-10-CM | POA: Diagnosis present

## 2021-03-17 DIAGNOSIS — S025XXB Fracture of tooth (traumatic), initial encounter for open fracture: Secondary | ICD-10-CM | POA: Diagnosis not present

## 2021-03-17 DIAGNOSIS — F1990 Other psychoactive substance use, unspecified, uncomplicated: Secondary | ICD-10-CM | POA: Diagnosis not present

## 2021-03-17 DIAGNOSIS — J189 Pneumonia, unspecified organism: Principal | ICD-10-CM

## 2021-03-17 DIAGNOSIS — Z20822 Contact with and (suspected) exposure to covid-19: Secondary | ICD-10-CM | POA: Diagnosis present

## 2021-03-17 DIAGNOSIS — A419 Sepsis, unspecified organism: Secondary | ICD-10-CM | POA: Diagnosis not present

## 2021-03-17 DIAGNOSIS — E871 Hypo-osmolality and hyponatremia: Secondary | ICD-10-CM | POA: Diagnosis present

## 2021-03-17 DIAGNOSIS — E43 Unspecified severe protein-calorie malnutrition: Secondary | ICD-10-CM | POA: Diagnosis present

## 2021-03-17 DIAGNOSIS — E876 Hypokalemia: Secondary | ICD-10-CM | POA: Diagnosis present

## 2021-03-17 DIAGNOSIS — K029 Dental caries, unspecified: Secondary | ICD-10-CM | POA: Diagnosis present

## 2021-03-17 DIAGNOSIS — R945 Abnormal results of liver function studies: Secondary | ICD-10-CM | POA: Diagnosis not present

## 2021-03-17 DIAGNOSIS — K5900 Constipation, unspecified: Secondary | ICD-10-CM | POA: Diagnosis present

## 2021-03-17 DIAGNOSIS — Z720 Tobacco use: Secondary | ICD-10-CM

## 2021-03-17 DIAGNOSIS — F1490 Cocaine use, unspecified, uncomplicated: Secondary | ICD-10-CM | POA: Diagnosis not present

## 2021-03-17 DIAGNOSIS — F32A Depression, unspecified: Secondary | ICD-10-CM | POA: Diagnosis present

## 2021-03-17 DIAGNOSIS — R7989 Other specified abnormal findings of blood chemistry: Secondary | ICD-10-CM | POA: Diagnosis not present

## 2021-03-17 LAB — URINALYSIS, ROUTINE W REFLEX MICROSCOPIC
Bacteria, UA: NONE SEEN
Glucose, UA: NEGATIVE mg/dL
Ketones, ur: NEGATIVE mg/dL
Leukocytes,Ua: NEGATIVE
Nitrite: NEGATIVE
Protein, ur: NEGATIVE mg/dL
Specific Gravity, Urine: 1.015 (ref 1.005–1.030)
pH: 6 (ref 5.0–8.0)

## 2021-03-17 LAB — COMPREHENSIVE METABOLIC PANEL
ALT: 32 U/L (ref 0–44)
AST: 83 U/L — ABNORMAL HIGH (ref 15–41)
Albumin: 1.7 g/dL — ABNORMAL LOW (ref 3.5–5.0)
Alkaline Phosphatase: 75 U/L (ref 38–126)
Anion gap: 7 (ref 5–15)
BUN: 13 mg/dL (ref 6–20)
CO2: 27 mmol/L (ref 22–32)
Calcium: 7.5 mg/dL — ABNORMAL LOW (ref 8.9–10.3)
Chloride: 90 mmol/L — ABNORMAL LOW (ref 98–111)
Creatinine, Ser: 0.79 mg/dL (ref 0.61–1.24)
GFR, Estimated: >60 mL/min (ref >60)
Glucose, Bld: 114 mg/dL — ABNORMAL HIGH (ref 70–99)
Potassium: 3.4 mmol/L — ABNORMAL LOW (ref 3.5–5.1)
Sodium: 124 mmol/L — ABNORMAL LOW (ref 135–145)
Total Bilirubin: 0.8 mg/dL (ref 0.3–1.2)
Total Protein: 6.8 g/dL (ref 6.5–8.1)

## 2021-03-17 LAB — CBC WITH DIFFERENTIAL/PLATELET
Abs Immature Granulocytes: 0.26 10*3/uL — ABNORMAL HIGH (ref 0.00–0.07)
Basophils Absolute: 0.1 10*3/uL (ref 0.0–0.1)
Basophils Relative: 1 %
Eosinophils Absolute: 0 10*3/uL (ref 0.0–0.5)
Eosinophils Relative: 0 %
HCT: 26.6 % — ABNORMAL LOW (ref 39.0–52.0)
Hemoglobin: 9.1 g/dL — ABNORMAL LOW (ref 13.0–17.0)
Immature Granulocytes: 1 %
Lymphocytes Relative: 6 %
Lymphs Abs: 1.2 10*3/uL (ref 0.7–4.0)
MCH: 33.3 pg (ref 26.0–34.0)
MCHC: 34.2 g/dL (ref 30.0–36.0)
MCV: 97.4 fL (ref 80.0–100.0)
Monocytes Absolute: 0.8 10*3/uL (ref 0.1–1.0)
Monocytes Relative: 4 %
Neutro Abs: 18.2 10*3/uL — ABNORMAL HIGH (ref 1.7–7.7)
Neutrophils Relative %: 88 %
Platelets: 427 10*3/uL — ABNORMAL HIGH (ref 150–400)
RBC: 2.73 MIL/uL — ABNORMAL LOW (ref 4.22–5.81)
RDW: 14.2 % (ref 11.5–15.5)
WBC: 20.6 10*3/uL — ABNORMAL HIGH (ref 4.0–10.5)
nRBC: 0 % (ref 0.0–0.2)

## 2021-03-17 LAB — RAPID URINE DRUG SCREEN, HOSP PERFORMED
Amphetamines: NOT DETECTED
Barbiturates: NOT DETECTED
Benzodiazepines: NOT DETECTED
Cocaine: POSITIVE — AB
Opiates: NOT DETECTED
Tetrahydrocannabinol: NOT DETECTED

## 2021-03-17 LAB — RESP PANEL BY RT-PCR (FLU A&B, COVID) ARPGX2
Influenza A by PCR: NEGATIVE
Influenza B by PCR: NEGATIVE
SARS Coronavirus 2 by RT PCR: NEGATIVE

## 2021-03-17 LAB — LACTIC ACID, PLASMA: Lactic Acid, Venous: 2.4 mmol/L (ref 0.5–1.9)

## 2021-03-17 LAB — OSMOLALITY, URINE: Osmolality, Ur: 473 mOsm/kg (ref 300–900)

## 2021-03-17 LAB — PROTIME-INR
INR: 1.6 — ABNORMAL HIGH (ref 0.8–1.2)
Prothrombin Time: 18.3 seconds — ABNORMAL HIGH (ref 11.4–15.2)

## 2021-03-17 LAB — APTT: aPTT: 50 seconds — ABNORMAL HIGH (ref 24–36)

## 2021-03-17 LAB — SODIUM, URINE, RANDOM: Sodium, Ur: <10 mmol/L

## 2021-03-17 MED ORDER — LACTATED RINGERS IV SOLN: INTRAVENOUS | Status: DC

## 2021-03-17 MED ORDER — ACETAMINOPHEN 500 MG PO TABS
1000.0000 mg | ORAL_TABLET | Freq: Four times a day (QID) | ORAL | Status: DC | PRN
  Administered 2021-03-19 – 2021-03-30 (×4): 1000 mg via ORAL
  Filled 2021-03-17 (×4): qty 2

## 2021-03-17 MED ORDER — ONDANSETRON HCL 4 MG PO TABS: 4.0000 mg | ORAL_TABLET | Freq: Four times a day (QID) | ORAL | Status: DC | PRN

## 2021-03-17 MED ORDER — NICOTINE 21 MG/24HR TD PT24
21.0000 mg | MEDICATED_PATCH | Freq: Every day | TRANSDERMAL | Status: DC
  Filled 2021-03-17 (×10): qty 1

## 2021-03-17 MED ORDER — SODIUM CHLORIDE 0.9 % IV SOLN
2.0000 g | INTRAVENOUS | Status: DC
  Administered 2021-03-17 – 2021-03-20 (×4): 2 g via INTRAVENOUS
  Filled 2021-03-17: qty 2
  Filled 2021-03-17 (×4): qty 20

## 2021-03-17 MED ORDER — SODIUM CHLORIDE 0.9 % IV SOLN
500.0000 mg | INTRAVENOUS | Status: DC
  Administered 2021-03-17 – 2021-03-19 (×3): 500 mg via INTRAVENOUS
  Filled 2021-03-17 (×3): qty 500

## 2021-03-17 MED ORDER — SENNOSIDES-DOCUSATE SODIUM 8.6-50 MG PO TABS: 1.0000 | ORAL_TABLET | Freq: Every evening | ORAL | Status: DC | PRN

## 2021-03-17 MED ORDER — ACETAMINOPHEN 650 MG RE SUPP: 650.0000 mg | Freq: Four times a day (QID) | RECTAL | Status: DC | PRN

## 2021-03-17 MED ORDER — LACTATED RINGERS IV BOLUS (SEPSIS)
250.0000 mL | Freq: Once | INTRAVENOUS | Status: AC
  Administered 2021-03-17: 250 mL via INTRAVENOUS

## 2021-03-17 MED ORDER — ACETAMINOPHEN 500 MG PO TABS
1000.0000 mg | ORAL_TABLET | Freq: Once | ORAL | Status: AC
  Administered 2021-03-17: 1000 mg via ORAL
  Filled 2021-03-17: qty 2

## 2021-03-17 MED ORDER — ALBUTEROL SULFATE HFA 108 (90 BASE) MCG/ACT IN AERS: 1.0000 | INHALATION_SPRAY | RESPIRATORY_TRACT | Status: DC | PRN

## 2021-03-17 MED ORDER — IBUPROFEN 200 MG PO TABS: 600.0000 mg | ORAL_TABLET | Freq: Four times a day (QID) | ORAL | Status: DC | PRN

## 2021-03-17 MED ORDER — LACTATED RINGERS IV BOLUS (SEPSIS)
1000.0000 mL | Freq: Once | INTRAVENOUS | Status: AC
  Administered 2021-03-17: 1000 mL via INTRAVENOUS

## 2021-03-17 MED ORDER — IOHEXOL 300 MG/ML  SOLN: 75.0000 mL | Freq: Once | INTRAMUSCULAR | Status: DC | PRN

## 2021-03-17 MED ORDER — TUBERCULIN PPD 5 UNIT/0.1ML ID SOLN
5.0000 [IU] | INTRADERMAL | Status: AC
  Administered 2021-03-17: 5 [IU] via INTRADERMAL
  Filled 2021-03-17 (×2): qty 0.1

## 2021-03-17 MED ORDER — GUAIFENESIN ER 600 MG PO TB12
1200.0000 mg | ORAL_TABLET | Freq: Two times a day (BID) | ORAL | Status: DC
  Administered 2021-03-17 – 2021-03-31 (×27): 1200 mg via ORAL
  Filled 2021-03-17 (×28): qty 2

## 2021-03-17 MED ORDER — ENOXAPARIN SODIUM 40 MG/0.4ML ~~LOC~~ SOLN
40.0000 mg | SUBCUTANEOUS | Status: DC
  Administered 2021-03-17 – 2021-03-30 (×12): 40 mg via SUBCUTANEOUS
  Filled 2021-03-17 (×13): qty 0.4

## 2021-03-17 MED ORDER — SODIUM CHLORIDE 0.9 % IV BOLUS
1000.0000 mL | Freq: Once | INTRAVENOUS | Status: AC
  Administered 2021-03-17: 1000 mL via INTRAVENOUS

## 2021-03-17 MED ORDER — ONDANSETRON HCL 4 MG/2ML IJ SOLN
4.0000 mg | Freq: Four times a day (QID) | INTRAMUSCULAR | Status: DC | PRN
Start: 2021-03-17 — End: 2021-03-31

## 2021-03-17 MED ORDER — POTASSIUM CHLORIDE IN NACL 40-0.9 MEQ/L-% IV SOLN
INTRAVENOUS | Status: AC
  Filled 2021-03-17 (×2): qty 1000
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
## 2021-03-17 NOTE — ED Triage Notes (Signed)
Pt brought from jail for cough and sob- EMS states this is a chronic issue and has been going on for 12 years. Pt told EMS RN at jail was not giving him his meds. Pt is coughing up green sputum and c/o sternal pain

## 2021-03-17 NOTE — ED Triage Notes (Signed)
Emergency Medicine Provider Triage Evaluation Note  Henry Harris , a 60 y.o. male  was evaluated in triage.  Pt complains of shortness of breath.  He reports he hasn't been getting his meds at the jail.  He refuses to answer questions in triage.    Physical Exam  BP (!) 99/45 (BP Location: Left Arm)   Pulse (!) 105   Temp (!) 100.6 F (38.1 C) (Oral)   Resp 20   Ht 5\' 8"  (1.727 m)   Wt 74.8 kg   SpO2 93%   BMI 25.09 kg/m   Patient laying in bed.  He is frequently coughing.  He is able to speak and answer questions.  He refuses to roll over for exam.   Medical Decision Making  Medically screening exam initiated at 5:12 PM.  Appropriate orders placed.  Henry Harris was informed that the remainder of the evaluation will be completed by another provider, this initial triage assessment does not replace that evaluation, and the importance of remaining in the ED until their evaluation is complete.  Clinical Impression   Patient is febrile, tachycardic with soft pressures. Concern that if cxr shows pna patient meets sepsis criteria and pressures are soft.   Patient will be moved into the back for evaluation.    Henry Harris, Henry Harris 03/17/21 1717

## 2021-03-17 NOTE — Sepsis Progress Note (Signed)
eLink is monitoring this Code Sepsis. °

## 2021-03-17 NOTE — Progress Notes (Signed)
Pt states he can't hear and he does not answer questions appropriately. Briscoe Burns BSN, RN-BC Admissions RN 03/17/2021 7:37 PM
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
## 2021-03-17 NOTE — ED Notes (Signed)
Patient transported to CT 

## 2021-03-18 DIAGNOSIS — J189 Pneumonia, unspecified organism: Secondary | ICD-10-CM | POA: Diagnosis not present

## 2021-03-18 DIAGNOSIS — D649 Anemia, unspecified: Secondary | ICD-10-CM | POA: Diagnosis not present

## 2021-03-18 DIAGNOSIS — R6520 Severe sepsis without septic shock: Secondary | ICD-10-CM | POA: Diagnosis not present

## 2021-03-18 DIAGNOSIS — A419 Sepsis, unspecified organism: Secondary | ICD-10-CM | POA: Diagnosis not present

## 2021-03-18 LAB — COMPREHENSIVE METABOLIC PANEL
ALT: 24 U/L (ref 0–44)
AST: 54 U/L — ABNORMAL HIGH (ref 15–41)
Albumin: 1.3 g/dL — ABNORMAL LOW (ref 3.5–5.0)
Alkaline Phosphatase: 56 U/L (ref 38–126)
Anion gap: 7 (ref 5–15)
BUN: 11 mg/dL (ref 6–20)
CO2: 24 mmol/L (ref 22–32)
Calcium: 7.1 mg/dL — ABNORMAL LOW (ref 8.9–10.3)
Chloride: 97 mmol/L — ABNORMAL LOW (ref 98–111)
Creatinine, Ser: 0.63 mg/dL (ref 0.61–1.24)
GFR, Estimated: >60 mL/min (ref >60)
Glucose, Bld: 129 mg/dL — ABNORMAL HIGH (ref 70–99)
Potassium: 3.1 mmol/L — ABNORMAL LOW (ref 3.5–5.1)
Sodium: 128 mmol/L — ABNORMAL LOW (ref 135–145)
Total Bilirubin: 0.8 mg/dL (ref 0.3–1.2)
Total Protein: 5.4 g/dL — ABNORMAL LOW (ref 6.5–8.1)

## 2021-03-18 LAB — CBC WITH DIFFERENTIAL/PLATELET
Abs Immature Granulocytes: 0 10*3/uL (ref 0.00–0.07)
Band Neutrophils: 2 %
Basophils Absolute: 0 10*3/uL (ref 0.0–0.1)
Basophils Relative: 0 %
Eosinophils Absolute: 0 10*3/uL (ref 0.0–0.5)
Eosinophils Relative: 0 %
HCT: 24.1 % — ABNORMAL LOW (ref 39.0–52.0)
Hemoglobin: 8.2 g/dL — ABNORMAL LOW (ref 13.0–17.0)
Lymphocytes Relative: 7 %
Lymphs Abs: 1.2 10*3/uL (ref 0.7–4.0)
MCH: 33.9 pg (ref 26.0–34.0)
MCHC: 34 g/dL (ref 30.0–36.0)
MCV: 99.6 fL (ref 80.0–100.0)
Monocytes Absolute: 0.2 10*3/uL (ref 0.1–1.0)
Monocytes Relative: 1 %
Neutro Abs: 15.9 10*3/uL — ABNORMAL HIGH (ref 1.7–7.7)
Neutrophils Relative %: 90 %
Platelets: 394 10*3/uL (ref 150–400)
RBC: 2.42 MIL/uL — ABNORMAL LOW (ref 4.22–5.81)
RDW: 14.5 % (ref 11.5–15.5)
WBC: 17.3 10*3/uL — ABNORMAL HIGH (ref 4.0–10.5)
nRBC: 0 % (ref 0.0–0.2)

## 2021-03-18 LAB — IRON AND TIBC: Iron: 22 ug/dL — ABNORMAL LOW (ref 45–182)

## 2021-03-18 LAB — PHOSPHORUS: Phosphorus: 2.8 mg/dL (ref 2.5–4.6)

## 2021-03-18 LAB — HIV ANTIBODY (ROUTINE TESTING W REFLEX): HIV Screen 4th Generation wRfx: NONREACTIVE

## 2021-03-18 LAB — OSMOLALITY: Osmolality: 264 mOsm/kg — ABNORMAL LOW (ref 275–295)

## 2021-03-18 LAB — PROTIME-INR
INR: 1.5 — ABNORMAL HIGH (ref 0.8–1.2)
Prothrombin Time: 17.6 seconds — ABNORMAL HIGH (ref 11.4–15.2)

## 2021-03-18 LAB — STREP PNEUMONIAE URINARY ANTIGEN: Strep Pneumo Urinary Antigen: NEGATIVE

## 2021-03-18 LAB — VITAMIN B12: Vitamin B-12: 173 pg/mL — ABNORMAL LOW (ref 180–914)

## 2021-03-18 LAB — EXPECTORATED SPUTUM ASSESSMENT W GRAM STAIN, RFLX TO RESP C

## 2021-03-18 LAB — FOLATE: Folate: 8.4 ng/mL (ref >5.9)

## 2021-03-18 LAB — PROCALCITONIN: Procalcitonin: 2.46 ng/mL

## 2021-03-18 LAB — LACTIC ACID, PLASMA: Lactic Acid, Venous: 1.1 mmol/L (ref 0.5–1.9)

## 2021-03-18 LAB — FERRITIN: Ferritin: 1368 ng/mL — ABNORMAL HIGH (ref 24–336)

## 2021-03-18 LAB — MAGNESIUM: Magnesium: 1.7 mg/dL (ref 1.7–2.4)

## 2021-03-18 MED ORDER — ENSURE ENLIVE PO LIQD
237.0000 mL | Freq: Three times a day (TID) | ORAL | Status: DC
  Administered 2021-03-18 – 2021-03-25 (×15): 237 mL via ORAL

## 2021-03-18 MED ORDER — POTASSIUM CHLORIDE CRYS ER 20 MEQ PO TBCR
40.0000 meq | EXTENDED_RELEASE_TABLET | Freq: Two times a day (BID) | ORAL | Status: AC
  Administered 2021-03-18 (×2): 40 meq via ORAL
  Filled 2021-03-18 (×2): qty 2

## 2021-03-18 MED ORDER — TRAZODONE HCL 100 MG PO TABS
100.0000 mg | ORAL_TABLET | Freq: Every evening | ORAL | Status: DC | PRN
  Administered 2021-03-18 – 2021-03-30 (×10): 100 mg via ORAL
  Filled 2021-03-18 (×11): qty 1

## 2021-03-18 NOTE — Progress Notes (Signed)
Patient pulled out 2 IV this morning. New IV was established by IV team this evening. Patient admits to not sleeping and the beeping of IV waking him causing him to "pull out" IV. He requested something to help him sleep tonight. He refused to try the soft mits as intervention. MD was notified and new orders were placed.

## 2021-03-18 NOTE — Progress Notes (Signed)
PROGRESS NOTE    Bubba HalesJimmy R Gloster  XBM:[id number] DOB: 02/12/1961 DOA: 03/17/2021 PCP: Patient, No Pcp Per (Inactive)    Brief Narrative:  60 y.o. male with medical history significant for hypertension and tobacco use who presents from jail for evaluation of shortness of breath and productive cough. Patient was admitted for PNA with TB work up given high risk factors  Assessment & Plan:   Principal Problem:   Severe sepsis (HCC) Active Problems:   Right upper lobe pneumonia   Hyponatremia   Normocytic anemia   Severe sepsis due to right upper lobe pneumonia, POA: Presenting with leukocytosis, tachycardia, tachypnea, fever, lactic acidosis, and hypotension.  CXR shows right upper lobe pneumonia.  -Patient is at high risk for TB given his social situation and reports of night sweats, weight loss, in addition to fevers.  Covid and influenza A/B are negative. -Continue on airborne precautions -CT chest reviewed. Findings of airspace consolidation involving the RUL and RML with air bronchograms without significant volume loss -Continued on empiric IV ceftriaxone and azithromycin -Sputum for AFB and NAA TB testing pending -HIV is neg -On minimal O2 support  Hyponatremia: Suspect hypovolemic hyponatremia.   Improved with IVF  Normocytic anemia: Hemoglobin 9.1 on admission.  Unclear baseline. -Repeat CBC in AM  Hypokalemia: Remains low today -Will replace  -Recheck bmet in AM  Tobacco use: Nicotine patch ordered.  Cocaine abuse -Urine drug screen is pos for cocaine, despite pt's incarcerated status -Will need cessation  DVT prophylaxis: Lovenox subq Code Status: Full Family Communication: Pt in room, family not at bedside  Status is: Inpatient  Remains inpatient appropriate because:Ongoing diagnostic testing needed not appropriate for outpatient work up and IV treatments appropriate due to intensity of illness or inability to take PO   [place]              [facility]              Patient currently is not medically stable to d/c.   Difficult to place patient No   Consultants:     Procedures:     Antimicrobials: Anti-infectives (From admission, onward)   Start     Dose/Rate Route Frequency Ordered Stop   03/17/21 1800  cefTRIAXone (ROCEPHIN) 2 g in sodium chloride 0.9 % 100 mL IVPB        2 g 200 mL/hr over 30 Minutes Intravenous Every 24 hours 03/17/21 1748     03/17/21 1800  azithromycin (ZITHROMAX) 500 mg in sodium chloride 0.9 % 250 mL IVPB        500 mg 250 mL/hr over 60 Minutes Intravenous Every 24 hours 03/17/21 1748         Subjective: Complains of not "feeling good"  Objective: Vitals:   03/18/21 0110 03/18/21 0200 03/18/21 0249 03/18/21 0648  BP: (!) 93/57 91/62 94/63  (!) 102/59  Pulse: 91 83 81 81  Resp: 18 (!) 30 20 20   Temp:  98.5 F (36.9 C) 98.4 F (36.9 C) 97.8 F (36.6 C)  TempSrc:  Oral Oral Oral  SpO2: 100% 91% 96% 94%  Weight:      Height:        Intake/Output Summary (Last 24 hours) at 03/18/2021 1519 Last data filed at 03/17/2021 2344 Gross per 24 hour  Intake 3538.18 ml  Output --  Net 3538.18 ml   Filed Weights   03/17/21 1710  Weight: 74.8 kg    Examination: General exam:  Awake, laying in bed, in nad Respiratory system: coarse breath sounds on R, no wheezing Cardiovascular system: regular rate, s1, s2 Gastrointestinal system: Soft, nondistended, positive BS Central nervous system: CN2-12 grossly intact, strength intact Extremities: Perfused, no clubbing Skin: Normal skin turgor, no notable skin lesions seen Psychiatry: Mood normal // no visual hallucinations   Data Reviewed: I have personally reviewed following labs and imaging studies  CBC: Recent Labs  Lab 03/17/21 1713 03/18/21 0405  WBC 20.6* 17.3*  NEUTROABS 18.2* 15.9*  HGB 9.1* 8.2*  HCT 26.6* 24.1*  MCV 97.4 99.6  PLT 427* 394   Basic Metabolic Panel: Recent Labs  Lab  03/17/21 1715 03/18/21 0405  NA 124* 128*  K 3.4* 3.1*  CL 90* 97*  CO2 27 24  GLUCOSE 114* 129*  BUN 13 11  CREATININE 0.79 0.63  CALCIUM 7.5* 7.1*  MG  --  1.7  PHOS  --  2.8   GFR: Estimated Creatinine Clearance: 97.4 mL/min (by C-G formula based on SCr of 0.63 mg/dL). Liver Function Tests: Recent Labs  Lab 03/17/21 1715 03/18/21 0405  AST 83* 54*  ALT 32 24  ALKPHOS 75 56  BILITOT 0.8 0.8  PROT 6.8 5.4*  ALBUMIN 1.7* 1.3*   No results for input(s): LIPASE, AMYLASE in the last 168 hours. No results for input(s): AMMONIA in the last 168 hours. Coagulation Profile: Recent Labs  Lab 03/17/21 1749 03/18/21 0405  INR 1.6* 1.5*   Cardiac Enzymes: No results for input(s): CKTOTAL, CKMB, CKMBINDEX, TROPONINI in the last 168 hours. BNP (last 3 results) No results for input(s): PROBNP in the last 8760 hours. HbA1C: No results for input(s): HGBA1C in the last 72 hours. CBG: No results for input(s): GLUCAP in the last 168 hours. Lipid Profile: No results for input(s): CHOL, HDL, LDLCALC, TRIG, CHOLHDL, LDLDIRECT in the last 72 hours. Thyroid Function Tests: No results for input(s): TSH, T4TOTAL, FREET4, T3FREE, THYROIDAB in the last 72 hours. Anemia Panel: Recent Labs    03/18/21 0405  VITAMINB12 173*  FOLATE 8.4  FERRITIN 1,368*  TIBC NOT CALCULATED  IRON 22*   Sepsis Labs: Recent Labs  Lab 03/17/21 1715 03/18/21 0001 03/18/21 0405  PROCALCITON  --   --  2.46  LATICACIDVEN 2.4* 1.1  --     Recent Results (from the past 240 hour(s))  Resp Panel by RT-PCR (Flu A&B, Covid) Nasopharyngeal Swab     Status: None   Collection Time: 03/17/21  5:23 PM   Specimen: Nasopharyngeal Swab; Nasopharyngeal(NP) swabs in vial transport medium  Result Value Ref Range Status   SARS Coronavirus 2 by RT PCR NEGATIVE NEGATIVE Final    Comment: (NOTE) SARS-CoV-2 target nucleic acids are NOT DETECTED.  The SARS-CoV-2 RNA is generally detectable in upper  respiratory specimens during the acute phase of infection. The lowest concentration of SARS-CoV-2 viral copies this assay can detect is 138 copies/mL. A negative result does not preclude SARS-Cov-2 infection and should not be used as the sole basis for treatment or other patient management decisions. A negative result may occur with  improper specimen collection/handling, submission of specimen other than nasopharyngeal swab, presence of viral mutation(s) within the areas targeted by this assay, and inadequate number of viral copies(<138 copies/mL). A negative result must be combined with clinical observations, patient history, and epidemiological information. The expected result is Negative.  Fact Sheet for Patients:  BloggerCourse.com  Fact Sheet for Healthcare Providers:  SeriousBroker.it  This test is no t yet approved or cleared  by the [place] and  has been authorized for detection and/or diagnosis of SARS-CoV-2 by FDA under an Emergency Use Authorization (EUA). This EUA will remain  in effect (meaning this test can be used) for the duration of the COVID-19 declaration under Section 564(b)(1) of the Act, 21 U.S.C.section 360bbb-3(b)(1), unless the authorization is terminated  or revoked sooner.       Influenza A by PCR NEGATIVE NEGATIVE Final   Influenza B by PCR NEGATIVE NEGATIVE Final    Comment: (NOTE) The Xpert Xpress SARS-CoV-2/FLU/RSV plus assay is intended as an aid in the diagnosis of influenza from Nasopharyngeal swab specimens and should not be used as a sole basis for treatment. Nasal washings and aspirates are unacceptable for Xpert Xpress SARS-CoV-2/FLU/RSV testing.  Fact Sheet for Patients: BloggerCourse.com  Fact Sheet for Healthcare Providers: SeriousBroker.it  This test is not yet approved or cleared by the [place] FDA and has been  authorized for detection and/or diagnosis of SARS-CoV-2 by FDA under an Emergency Use Authorization (EUA). This EUA will remain in effect (meaning this test can be used) for the duration of the COVID-19 declaration under Section 564(b)(1) of the Act, 21 U.S.C. section 360bbb-3(b)(1), unless the authorization is terminated or revoked.  Performed at [facility], [address] [place], [place] [address]   Culture, sputum-assessment     Status: None   Collection Time: 03/18/21 12:03 AM   Specimen: Sputum  Result Value Ref Range Status   Specimen Description SPU  Final   Special Requests NONE  Final   Sputum evaluation   Final    THIS SPECIMEN IS ACCEPTABLE FOR SPUTUM CULTURE Performed at [facility], [address], [place], [place] [address]    Report Status 03/18/2021 FINAL  Final  Culture, Respiratory w Gram Stain     Status: None (Preliminary result)   Collection Time: 03/18/21 12:03 AM   Specimen: Sputum  Result Value Ref Range Status   Specimen Description   Final    SPU Performed at [facility], [address], [place], [place] [address]    Special Requests   Final    NONE Reflexed from [id number] Performed at [facility], [address], [place], [place] [address]    Gram Stain   Final    RARE WBC PRESENT, PREDOMINANTLY PMN RARE GRAM NEGATIVE RODS RARE YEAST RARE GRAM POSITIVE RODS Performed at [facility], [address], [place], [place] [address]    Culture PENDING  Incomplete   Report Status PENDING  Incomplete     Radiology Studies: DG Chest 2 View  Result Date: 03/17/2021 CLINICAL DATA:  60 year old male with shortness of breath and cough. EXAM: CHEST - 2 VIEW COMPARISON:  Chest radiograph dated 02/05/2016. FINDINGS: Diffuse interstitial and airspace opacity primarily involving the right upper lobe most consistent with pneumonia. Clinical correlation and follow-up after  treatment to resolution recommended to exclude a neoplastic process. The left lung is clear. No pleural effusion pneumothorax. The cardiac silhouette is within limits. No acute osseous pathology. IMPRESSION: Right upper lobe pneumonia. Electronically Signed   By: Elgie Collard M.D.   On: 03/17/2021 17:48   CT CHEST WO CONTRAST  Result Date: 03/17/2021 CLINICAL DATA:  Shortness of breath and fever. COVID negative. Pneumonia on chest radiograph. EXAM: CT CHEST WITHOUT CONTRAST TECHNIQUE: Multidetector CT imaging of the chest was performed following the standard protocol without IV contrast. COMPARISON:  Chest radiograph 03/17/2021 FINDINGS: Cardiovascular:  Normal heart size. No pericardial effusions. Coronary artery and aortic calcification. Normal caliber of the thoracic aorta. Mediastinum/Nodes: Prominent mediastinal lymph nodes with pretracheal nodes measuring up to about 11 mm short axis dimension. Nonspecific etiology. Esophagus is decompressed. Lungs/Pleura: Airspace consolidation involving the right upper lung and right middle lung with air bronchograms and without significant volume loss. Changes most likely represent consolidative pneumonia. Follow-up after resolution of the acute process is recommended to exclude a centrally obstructing lesion. The right lower lung and the left lung are clear. Mild emphysematous changes suggested in the left apex. No pleural effusion or pneumothorax. Upper Abdomen: No acute abnormalities demonstrated in the visualized upper abdomen. Musculoskeletal: Degenerative changes in the spine. IMPRESSION: Airspace consolidation involving the right upper lung and right middle lung with air bronchograms and without significant volume loss. Changes most likely represent consolidative pneumonia. Follow-up after resolution of the acute process is recommended to exclude a centrally obstructing lesion. Aortic Atherosclerosis (ICD10-I70.0). Electronically Signed   By: Burman Nieves  M.D.   On: 03/17/2021 22:23    Scheduled Meds: . enoxaparin (LOVENOX) injection  40 mg Subcutaneous Q24H  . feeding supplement  237 mL Oral TID BM  . guaiFENesin  1,200 mg Oral BID  . nicotine  21 mg Transdermal Daily  . potassium chloride  40 mEq Oral BID  . tuberculin  5 Units Intradermal STAT   Continuous Infusions: . azithromycin Stopped (03/17/21 2142)  . cefTRIAXone (ROCEPHIN)  IV Stopped (03/17/21 1830)     LOS: 1 day   Rickey Barbara, MD [facility] Pager On Amion  If 7PM-7AM, please contact night-coverage 03/18/2021, 3:20 PM

## 2021-03-18 NOTE — TOC Progression Note (Signed)
Transition of Care [facility]) - Progression Note    Patient Details  Name: Henry Harris MRN: [id number] Date of Birth: [birth year]  [facility] CM/SW Contact  Geni Bers, RN Phone Number: 03/18/2021, 9:47 AM  Clinical Narrative:     [facility] and [facility].   Expected Discharge Plan: [facility] Barriers to Discharge: No Barriers Identified  Expected Discharge Plan and Services Expected Discharge Plan: [facility]                                               Social Determinants of Health (SDOH) Interventions    Readmission Risk Interventions No flowsheet data found.

## 2021-03-18 NOTE — Plan of Care (Signed)
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
## 2021-03-18 NOTE — Progress Notes (Signed)
Initial Nutrition Assessment  DOCUMENTATION CODES:   Not applicable  INTERVENTION:   Encourage PO intake  Ensure Enlive po TID, each supplement provides 350 kcal and 20 grams of protein   NUTRITION DIAGNOSIS:   Increased nutrient needs related to  (PNA) as evidenced by estimated needs.  GOAL:   Patient will meet greater than or equal to 90% of their needs  MONITOR:   PO intake,Supplement acceptance  REASON FOR ASSESSMENT:   Malnutrition Screening Tool    ASSESSMENT:   Pt with PMH of HTN and tobacco abuse from jail admitted with severe sepsis due to RUL PNA.   Pt being ruled out for TB. Officer at bedside. Pt seems somewhat agitated about his disability. Per hx pt provides pt has been in jail x 8 days. Prior to this, pt was homeless living on disability and food stamps. Pt has tried ensure but is unable to afford them. He reports gradual weight loss over the last year but unable to give a lot of specifics.  Suspect malnutrition with pt hx and appearance. NFPE deferred due to agitation.   Medications reviewed and include: KCl 40 mEq BID Labs reviewed: Na 128, K+ 3.1     Diet Order:   Diet Order            Diet regular Room service appropriate? Yes; Fluid consistency: Thin  Diet effective now                 EDUCATION NEEDS:   Not appropriate for education at this time  Skin:  Skin Assessment: Reviewed RN Assessment  Last BM:  unknown  Height:   Ht Readings from Last 1 Encounters:  03/17/21 5\' 8"  (1.727 m)    Weight:   Wt Readings from Last 1 Encounters:  03/17/21 74.8 kg    Ideal Body Weight:     BMI:  Body mass index is 25.09 kg/m.  Estimated Nutritional Needs:   Kcal:  1900-2100  Protein:  95-110 grams  Fluid:  >1.9 L/day  05/17/21., RD, LDN, CNSC See AMiON for contact information

## 2021-03-19 DIAGNOSIS — A419 Sepsis, unspecified organism: Secondary | ICD-10-CM | POA: Diagnosis not present

## 2021-03-19 DIAGNOSIS — R6520 Severe sepsis without septic shock: Secondary | ICD-10-CM | POA: Diagnosis not present

## 2021-03-19 LAB — COMPREHENSIVE METABOLIC PANEL
ALT: 40 U/L (ref 0–44)
AST: 105 U/L — ABNORMAL HIGH (ref 15–41)
Albumin: 1.4 g/dL — ABNORMAL LOW (ref 3.5–5.0)
Alkaline Phosphatase: 54 U/L (ref 38–126)
Anion gap: 7 (ref 5–15)
BUN: 8 mg/dL (ref 6–20)
CO2: 22 mmol/L (ref 22–32)
Calcium: 7.4 mg/dL — ABNORMAL LOW (ref 8.9–10.3)
Chloride: 102 mmol/L (ref 98–111)
Creatinine, Ser: 0.67 mg/dL (ref 0.61–1.24)
GFR, Estimated: >60 mL/min (ref >60)
Glucose, Bld: 93 mg/dL (ref 70–99)
Potassium: 4.5 mmol/L (ref 3.5–5.1)
Sodium: 131 mmol/L — ABNORMAL LOW (ref 135–145)
Total Bilirubin: 0.1 mg/dL — ABNORMAL LOW (ref 0.3–1.2)
Total Protein: 5.7 g/dL — ABNORMAL LOW (ref 6.5–8.1)

## 2021-03-19 LAB — URINE CULTURE: Culture: NO GROWTH

## 2021-03-19 LAB — CBC
HCT: 26.1 % — ABNORMAL LOW (ref 39.0–52.0)
Hemoglobin: 8.5 g/dL — ABNORMAL LOW (ref 13.0–17.0)
MCH: 32.9 pg (ref 26.0–34.0)
MCHC: 32.6 g/dL (ref 30.0–36.0)
MCV: 101.2 fL — ABNORMAL HIGH (ref 80.0–100.0)
Platelets: 458 10*3/uL — ABNORMAL HIGH (ref 150–400)
RBC: 2.58 MIL/uL — ABNORMAL LOW (ref 4.22–5.81)
RDW: 15.1 % (ref 11.5–15.5)
WBC: 8.9 10*3/uL (ref 4.0–10.5)
nRBC: 0 % (ref 0.0–0.2)

## 2021-03-19 LAB — LEGIONELLA PNEUMOPHILA SEROGP 1 UR AG: L. pneumophila Serogp 1 Ur Ag: NEGATIVE

## 2021-03-19 LAB — CULTURE, BLOOD (ROUTINE X 2): Special Requests: ADEQUATE

## 2021-03-19 MED ORDER — PANTOPRAZOLE SODIUM 40 MG PO TBEC
40.0000 mg | DELAYED_RELEASE_TABLET | Freq: Every day | ORAL | Status: DC
  Administered 2021-03-19 – 2021-03-31 (×13): 40 mg via ORAL
  Filled 2021-03-19 (×13): qty 1

## 2021-03-19 NOTE — Progress Notes (Signed)
PROGRESS NOTE    Henry Harris  ZOX:[id number] DOB: 06/14/1961 DOA: 03/17/2021 PCP: Patient, No Pcp Per (Inactive)    Brief Narrative:  60 y.o. male with medical history significant for hypertension and tobacco use who presents from jail for evaluation of shortness of breath and productive cough. Patient was admitted for PNA with TB work up given high risk factors  Assessment & Plan:   Principal Problem:   Severe sepsis (HCC) Active Problems:   Right upper lobe pneumonia   Hyponatremia   Normocytic anemia   Severe sepsis due to right upper lobe pneumonia, POA: Presenting with leukocytosis, tachycardia, tachypnea, fever, lactic acidosis, and hypotension.  CXR shows right upper lobe pneumonia.  -Patient is at high risk for TB given his social situation and reports of night sweats, weight loss, in addition to fevers.  Covid and influenza A/B are negative. -Continue on airborne precautions -CT chest reviewed. Findings of airspace consolidation involving the RUL and RML with air bronchograms without significant volume loss -Continued on empiric IV ceftriaxone and azithromycin -Sputum for AFB and NAA TB testing remains pending -HIV is neg -On minimal O2 support -Leukocytosis normalized  Hyponatremia: Suspect hypovolemic hyponatremia.   Now much improved with IVF -Recheck bmet in AM  Normocytic anemia: Hemoglobin 9.1 on admission.  Unclear baseline. -Repeat CBC in AM  Hypokalemia: Remains low today -Will replace  -Recheck bmet in AM  Tobacco use: Nicotine patch had been ordered.  Cocaine abuse -Urine drug screen is pos for cocaine, despite pt's incarcerated status -Will need cessation  DVT prophylaxis: Lovenox subq Code Status: Full Family Communication: Pt in room, family not at bedside  Status is: Inpatient  Remains inpatient appropriate because:Ongoing diagnostic testing needed not appropriate for outpatient work up and IV treatments appropriate due to  intensity of illness or inability to take PO   [facility]: [facility]              [facility]              Patient currently is not medically stable to d/c.   Difficult to place patient No   Consultants:     Procedures:     Antimicrobials: Anti-infectives (From admission, onward)   Start     Dose/Rate Route Frequency Ordered Stop   03/17/21 1800  cefTRIAXone (ROCEPHIN) 2 g in sodium chloride 0.9 % 100 mL IVPB        2 g 200 mL/hr over 30 Minutes Intravenous Every 24 hours 03/17/21 1748     03/17/21 1800  azithromycin (ZITHROMAX) 500 mg in sodium chloride 0.9 % 250 mL IVPB        500 mg 250 mL/hr over 60 Minutes Intravenous Every 24 hours 03/17/21 1748        Subjective: Complaining of chest discomfort over R side of chest  Objective: Vitals:   03/18/21 2240 03/19/21 0111 03/19/21 0608 03/19/21 1338  BP: 102/62  101/62 107/65  Pulse: 88  85 91  Resp: 20  16 16   Temp: (!) 100.6 F (38.1 C) 98.8 F (37.1 C) 98.1 F (36.7 C) 98.8 F (37.1 C)  TempSrc: Oral Oral Oral Oral  SpO2: 92%  93% 94%  Weight:      Height:        Intake/Output Summary (Last 24 hours) at 03/19/2021 1423 Last data filed at 03/19/2021 0600 Gross per 24 hour  Intake 360 ml  Output 500 ml  Net -140 ml  Filed Weights   03/17/21 1710  Weight: 74.8 kg    Examination: General exam: Conversant, in no acute distress Respiratory system: normal chest rise, clear, no audible wheezing Cardiovascular system: regular rhythm, s1-s2 Gastrointestinal system: Nondistended, nontender, pos BS Central nervous system: No seizures, no tremors Extremities: No cyanosis, no joint deformities Skin: No rashes, no pallor Psychiatry: Affect normal // no auditory hallucinations   Data Reviewed: I have personally reviewed following labs and imaging studies  CBC: Recent Labs  Lab 03/17/21 1713 03/18/21 0405 03/19/21 0415  WBC 20.6* 17.3* 8.9  NEUTROABS 18.2* 15.9*  --   HGB 9.1*  8.2* 8.5*  HCT 26.6* 24.1* 26.1*  MCV 97.4 99.6 101.2*  PLT 427* 394 458*   Basic Metabolic Panel: Recent Labs  Lab 03/17/21 1715 03/18/21 0405 03/19/21 0415  NA 124* 128* 131*  K 3.4* 3.1* 4.5  CL 90* 97* 102  CO2 GLUCOSE 114* 129* 93  BUN CREATININE 0.79 0.63 0.67  CALCIUM 7.5* 7.1* 7.4*  MG  --  1.7  --   PHOS  --  2.8  --    GFR: Estimated Creatinine Clearance: 97.4 mL/min (by C-G formula based on SCr of 0.67 mg/dL). Liver Function Tests: Recent Labs  Lab 03/17/21 1715 03/18/21 0405 03/19/21 0415  AST 83* 54* 105*  ALT 32 24 40  ALKPHOS 75 56 54  BILITOT 0.8 0.8 0.1*  PROT 6.8 5.4* 5.7*  ALBUMIN 1.7* 1.3* 1.4*   No results for input(s): LIPASE, AMYLASE in the last 168 hours. No results for input(s): AMMONIA in the last 168 hours. Coagulation Profile: Recent Labs  Lab 03/17/21 1749 03/18/21 0405  INR 1.6* 1.5*   Cardiac Enzymes: No results for input(s): CKTOTAL, CKMB, CKMBINDEX, TROPONINI in the last 168 hours. BNP (last 3 results) No results for input(s): PROBNP in the last 8760 hours. HbA1C: No results for input(s): HGBA1C in the last 72 hours. CBG: No results for input(s): GLUCAP in the last 168 hours. Lipid Profile: No results for input(s): CHOL, HDL, LDLCALC, TRIG, CHOLHDL, LDLDIRECT in the last 72 hours. Thyroid Function Tests: No results for input(s): TSH, T4TOTAL, FREET4, T3FREE, THYROIDAB in the last 72 hours. Anemia Panel: Recent Labs    03/18/21 0405  VITAMINB12 173*  FOLATE 8.4  FERRITIN 1,368*  TIBC NOT CALCULATED  IRON 22*   Sepsis Labs: Recent Labs  Lab 03/17/21 1715 03/18/21 0001 03/18/21 0405  PROCALCITON  --   --  2.46  LATICACIDVEN 2.4* 1.1  --     Recent Results (from the past 240 hour(s))  Resp Panel by RT-PCR (Flu A&B, Covid) Nasopharyngeal Swab     Status: None   Collection Time: 03/17/21  5:23 PM   Specimen: Nasopharyngeal Swab; Nasopharyngeal(NP) swabs in vial transport medium  Result  Value Ref Range Status   SARS Coronavirus 2 by RT PCR NEGATIVE NEGATIVE Final    Comment: (NOTE) SARS-CoV-2 target nucleic acids are NOT DETECTED.  The SARS-CoV-2 RNA is generally detectable in upper respiratory specimens during the acute phase of infection. The lowest concentration of SARS-CoV-2 viral copies this assay can detect is 138 copies/mL. A negative result does not preclude SARS-Cov-2 infection and should not be used as the sole basis for treatment or other patient management decisions. A negative result may occur with  improper specimen collection/handling, submission of specimen other than nasopharyngeal swab, presence of viral mutation(s) within the areas targeted by this assay, and inadequate number of viral copies(<138  copies/mL). A negative result must be combined with clinical observations, patient history, and epidemiological information. The expected result is Negative.  Fact Sheet for Patients:  BloggerCourse.com  Fact Sheet for Healthcare Providers:  SeriousBroker.it  This test is no t yet approved or cleared by the [place] FDA and  has been authorized for detection and/or diagnosis of SARS-CoV-2 by FDA under an Emergency Use Authorization (EUA). This EUA will remain  in effect (meaning this test can be used) for the duration of the COVID-19 declaration under Section 564(b)(1) of the Act, 21 U.S.C.section 360bbb-3(b)(1), unless the authorization is terminated  or revoked sooner.       Influenza A by PCR NEGATIVE NEGATIVE Final   Influenza B by PCR NEGATIVE NEGATIVE Final    Comment: (NOTE) The Xpert Xpress SARS-CoV-2/FLU/RSV plus assay is intended as an aid in the diagnosis of influenza from Nasopharyngeal swab specimens and should not be used as a sole basis for treatment. Nasal washings and aspirates are unacceptable for Xpert Xpress SARS-CoV-2/FLU/RSV testing.  Fact Sheet for  Patients: BloggerCourse.com  Fact Sheet for Healthcare Providers: SeriousBroker.it  This test is not yet approved or cleared by the [place] FDA and has been authorized for detection and/or diagnosis of SARS-CoV-2 by FDA under an Emergency Use Authorization (EUA). This EUA will remain in effect (meaning this test can be used) for the duration of the COVID-19 declaration under Section 564(b)(1) of the Act, 21 U.S.C. section 360bbb-3(b)(1), unless the authorization is terminated or revoked.  Performed at [facility], [address], [place], [place] [address]   Culture, blood (routine x 2)     Status: None (Preliminary result)   Collection Time: 03/17/21  5:42 PM   Specimen: BLOOD  Result Value Ref Range Status   Specimen Description   Final    BLOOD RIGHT ANTECUBITAL Performed at [facility], [address] [place], [place] [address]    Special Requests   Final    BOTTLES DRAWN AEROBIC AND ANAEROBIC Blood Culture adequate volume Performed at [facility], [address] [place], [place] [address]    Culture   Final    NO GROWTH 2 DAYS Performed at [facility], [address] [place], [place] [address]    Report Status PENDING  Incomplete  Culture, blood (routine x 2)     Status: None (Preliminary result)   Collection Time: 03/17/21  5:57 PM   Specimen: BLOOD  Result Value Ref Range Status   Specimen Description   Final    BLOOD LEFT ANTECUBITAL Performed at [facility], [address] [place], [place] [address]    Special Requests   Final    BOTTLES DRAWN AEROBIC AND ANAEROBIC Blood Culture adequate volume Performed at [facility], [address], [place], [place] [address]    Culture   Final    NO GROWTH 2 DAYS Performed at [facility], [address] [place], [place] [address]    Report Status  PENDING  Incomplete  Urine culture     Status: None   Collection Time: 03/17/21 10:13 PM   Specimen: In/Out Cath Urine  Result Value Ref Range Status   Specimen Description   Final    IN/OUT CATH URINE Performed at [facility], [address], [place], [place] [address]    Special Requests   Final    NONE Performed at [facility], [address] [place], [place] [address]    Culture   Final    NO GROWTH Performed at [facility], [address] [place], [place] [address]    Report Status 03/19/2021 FINAL  Final  Culture, sputum-assessment     Status: None   Collection Time: 03/18/21 12:03 AM   Specimen: Sputum  Result Value Ref Range Status   Specimen Description SPU  Final   Special Requests NONE  Final   Sputum evaluation   Final    THIS SPECIMEN IS ACCEPTABLE FOR SPUTUM CULTURE Performed at [facility], [address], [place], [place] [address]    Report Status 03/18/2021 FINAL  Final  Culture, Respiratory w Gram Stain     Status: None (Preliminary result)   Collection Time: 03/18/21 12:03 AM   Specimen: Sputum  Result Value Ref Range Status   Specimen Description   Final    SPU Performed at [facility], [address], [place], [place] [address]    Special Requests   Final    NONE Reflexed from [id number] Performed at [facility], [address], [place], [place] [address]    Gram Stain   Final    RARE WBC PRESENT, PREDOMINANTLY PMN RARE GRAM NEGATIVE RODS RARE YEAST RARE GRAM POSITIVE RODS    Culture   Final    CULTURE REINCUBATED FOR BETTER GROWTH Performed at [facility], [address] [place], [place] [address]    Report Status PENDING  Incomplete     Radiology Studies: DG Chest 2 View  Result Date: 03/17/2021 CLINICAL DATA:  60 year old male with shortness of breath and cough. EXAM: CHEST - 2 VIEW COMPARISON:  Chest radiograph dated  02/05/2016. FINDINGS: Diffuse interstitial and airspace opacity primarily involving the right upper lobe most consistent with pneumonia. Clinical correlation and follow-up after treatment to resolution recommended to exclude a neoplastic process. The left lung is clear. No pleural effusion pneumothorax. The cardiac silhouette is within limits. No acute osseous pathology. IMPRESSION: Right upper lobe pneumonia. Electronically Signed   By: Elgie Collard M.D.   On: 03/17/2021 17:48   CT CHEST WO CONTRAST  Result Date: 03/17/2021 CLINICAL DATA:  Shortness of breath and fever. COVID negative. Pneumonia on chest radiograph. EXAM: CT CHEST WITHOUT CONTRAST TECHNIQUE: Multidetector CT imaging of the chest was performed following the standard protocol without IV contrast. COMPARISON:  Chest radiograph 03/17/2021 FINDINGS: Cardiovascular: Normal heart size. No pericardial effusions. Coronary artery and aortic calcification. Normal caliber of the thoracic aorta. Mediastinum/Nodes: Prominent mediastinal lymph nodes with pretracheal nodes measuring up to about 11 mm short axis dimension. Nonspecific etiology. Esophagus is decompressed. Lungs/Pleura: Airspace consolidation involving the right upper lung and right middle lung with air bronchograms and without significant volume loss. Changes most likely represent consolidative pneumonia. Follow-up after resolution of the acute process is recommended to exclude a centrally obstructing lesion. The right lower lung and the left lung are clear. Mild emphysematous changes suggested in the left apex. No pleural effusion or pneumothorax. Upper Abdomen: No acute abnormalities demonstrated in the visualized upper abdomen. Musculoskeletal: Degenerative changes in the spine. IMPRESSION: Airspace consolidation involving the right upper lung and right middle lung with air bronchograms and without significant volume loss. Changes most likely represent consolidative pneumonia. Follow-up  after resolution of the acute process is recommended to exclude a centrally obstructing lesion. Aortic Atherosclerosis (ICD10-I70.0). Electronically Signed   By: Burman Nieves M.D.   On: 03/17/2021 22:23  Scheduled Meds: . enoxaparin (LOVENOX) injection  40 mg Subcutaneous Q24H  . feeding supplement  237 mL Oral TID BM  . guaiFENesin  1,200 mg Oral BID  . nicotine  21 mg Transdermal Daily  . tuberculin  5 Units Intradermal STAT   Continuous Infusions: . azithromycin 500 mg (03/18/21 1738)  . cefTRIAXone (ROCEPHIN)  IV 2 g (03/18/21 1701)     LOS: 2 days   Rickey Barbara, MD [facility] Pager On Amion  If 7PM-7AM, please contact night-coverage 03/19/2021, 2:23 PM

## 2021-03-20 DIAGNOSIS — J189 Pneumonia, unspecified organism: Secondary | ICD-10-CM | POA: Diagnosis not present

## 2021-03-20 DIAGNOSIS — D649 Anemia, unspecified: Secondary | ICD-10-CM | POA: Diagnosis not present

## 2021-03-20 DIAGNOSIS — R6520 Severe sepsis without septic shock: Secondary | ICD-10-CM | POA: Diagnosis not present

## 2021-03-20 DIAGNOSIS — A419 Sepsis, unspecified organism: Secondary | ICD-10-CM | POA: Diagnosis not present

## 2021-03-20 LAB — CULTURE, RESPIRATORY W GRAM STAIN: Culture: NORMAL

## 2021-03-20 MED ORDER — AZITHROMYCIN 250 MG PO TABS
500.0000 mg | ORAL_TABLET | Freq: Every day | ORAL | Status: DC
  Administered 2021-03-20 – 2021-03-21 (×2): 500 mg via ORAL
  Filled 2021-03-20 (×2): qty 2

## 2021-03-20 NOTE — Progress Notes (Signed)
PROGRESS NOTE    Henry Harris  RRN:[id number] DOB: February 05, 1961 DOA: 03/17/2021 PCP: Patient, No Pcp Per (Inactive)    Brief Narrative:  60 y.o. male with medical history significant for hypertension and tobacco use who presents from jail for evaluation of shortness of breath and productive cough. Patient was admitted for PNA with TB work up given high risk factors  Assessment & Plan:   Principal Problem:   Severe sepsis (HCC) Active Problems:   Right upper lobe pneumonia   Hyponatremia   Normocytic anemia   Severe sepsis due to right upper lobe pneumonia, POA: Presenting with leukocytosis, tachycardia, tachypnea, fever, lactic acidosis, and hypotension.  CXR shows right upper lobe pneumonia.  -Patient is at high risk for TB given his social situation and reports of night sweats, weight loss, in addition to fevers.  Covid and influenza A/B are negative. -Continue on airborne precautions -CT chest reviewed. Findings of airspace consolidation involving the RUL and RML with air bronchograms without significant volume loss -Continued on empiric IV ceftriaxone and azithromycin -Sputum for AFB and NAA TB testing still pending -HIV is neg -On minimal O2 support -Leukocytosis normalized  Hyponatremia: Suspect hypovolemic hyponatremia.   Now much improved with IVF -Recheck bmet in AM  Normocytic anemia: Hemoglobin 9.1 on admission.  Unclear baseline. -Repeat CBC in AM  Hypokalemia: Remains low today -Will replace  -Recheck bmet in AM  Tobacco use: Nicotine patch had been ordered.  Cocaine abuse -Urine drug screen is pos for cocaine, despite pt's incarcerated status -Will need cessation  DVT prophylaxis: Lovenox subq Code Status: Full Family Communication: Pt in room, family not at bedside  Status is: Inpatient  Remains inpatient appropriate because:Ongoing diagnostic testing needed not appropriate for outpatient work up and IV treatments appropriate due to intensity  of illness or inability to take PO   Dispo: The patient is from: [place]              Anticipated d/c is to: [facility]              Patient currently is not medically stable to d/c.   Difficult to place patient No   Consultants:     Procedures:     Antimicrobials: Anti-infectives (From admission, onward)   Start     Dose/Rate Route Frequency Ordered Stop   03/20/21 1200  azithromycin (ZITHROMAX) tablet 500 mg        500 mg Oral Daily 03/20/21 1026     03/17/21 1800  cefTRIAXone (ROCEPHIN) 2 g in sodium chloride 0.9 % 100 mL IVPB        2 g 200 mL/hr over 30 Minutes Intravenous Every 24 hours 03/17/21 1748     03/17/21 1800  azithromycin (ZITHROMAX) 500 mg in sodium chloride 0.9 % 250 mL IVPB  Status:  Discontinued        500 mg 250 mL/hr over 60 Minutes Intravenous Every 24 hours 03/17/21 1748 03/20/21 1026      Subjective: Without complaints. Denies pain  Objective: Vitals:   03/19/21 1338 03/19/21 2102 03/19/21 2109 03/20/21 0551  BP: 107/65 131/72 131/72 (!) 98/57  Pulse: 91 93 93 71  Resp: 16 18 18 16   Temp: 98.8 F (37.1 C) (!) 102.3 F (39.1 C) (!) 100.9 F (38.3 C) 98 F (36.7 C)  TempSrc: Oral Oral Oral Oral  SpO2: 94% 100% 100%   Weight:      Height:        Intake/Output Summary (Last 24 hours) at  03/20/2021 1335 Last data filed at 03/20/2021 9024 Gross per 24 hour  Intake --  Output 700 ml  Net -700 ml   Filed Weights   03/17/21 1710  Weight: 74.8 kg    Examination: General exam: Awake, laying in bed, in nad Respiratory system: Normal respiratory effort, no wheezing Cardiovascular system: regular rate, s1, s2 Gastrointestinal system: Soft, nondistended, positive BS Central nervous system: CN2-12 grossly intact, strength intact Extremities: Perfused, no clubbing Skin: Normal skin turgor, no notable skin lesions seen Psychiatry: Mood normal // no visual hallucinations    Data Reviewed: I have personally reviewed following labs and imaging  studies  CBC: Recent Labs  Lab 03/17/21 1713 03/18/21 0405 03/19/21 0415  WBC 20.6* 17.3* 8.9  NEUTROABS 18.2* 15.9*  --   HGB 9.1* 8.2* 8.5*  HCT 26.6* 24.1* 26.1*  MCV 97.4 99.6 101.2*  PLT 427* 394 458*   Basic Metabolic Panel: Recent Labs  Lab 03/17/21 1715 03/18/21 0405 03/19/21 0415  NA 124* 128* 131*  K 3.4* 3.1* 4.5  CL 90* 97* 102  CO2 27 24 22   GLUCOSE 114* 129* 93  BUN 13 11 8   CREATININE 0.79 0.63 0.67  CALCIUM 7.5* 7.1* 7.4*  MG  --  1.7  --   PHOS  --  2.8  --    GFR: Estimated Creatinine Clearance: 96.2 mL/min (by C-G formula based on SCr of 0.67 mg/dL). Liver Function Tests: Recent Labs  Lab 03/17/21 1715 03/18/21 0405 03/19/21 0415  AST 83* 54* 105*  ALT 32 24 40  ALKPHOS 75 56 54  BILITOT 0.8 0.8 0.1*  PROT 6.8 5.4* 5.7*  ALBUMIN 1.7* 1.3* 1.4*   No results for input(s): LIPASE, AMYLASE in the last 168 hours. No results for input(s): AMMONIA in the last 168 hours. Coagulation Profile: Recent Labs  Lab 03/17/21 1749 03/18/21 0405  INR 1.6* 1.5*   Cardiac Enzymes: No results for input(s): CKTOTAL, CKMB, CKMBINDEX, TROPONINI in the last 168 hours. BNP (last 3 results) No results for input(s): PROBNP in the last 8760 hours. HbA1C: No results for input(s): HGBA1C in the last 72 hours. CBG: No results for input(s): GLUCAP in the last 168 hours. Lipid Profile: No results for input(s): CHOL, HDL, LDLCALC, TRIG, CHOLHDL, LDLDIRECT in the last 72 hours. Thyroid Function Tests: No results for input(s): TSH, T4TOTAL, FREET4, T3FREE, THYROIDAB in the last 72 hours. Anemia Panel: Recent Labs    03/18/21 0405  VITAMINB12 173*  FOLATE 8.4  FERRITIN 1,368*  TIBC NOT CALCULATED  IRON 22*   Sepsis Labs: Recent Labs  Lab 03/17/21 1715 03/18/21 0001 03/18/21 0405  PROCALCITON  --   --  2.46  LATICACIDVEN 2.4* 1.1  --     Recent Results (from the past 240 hour(s))  Resp Panel by RT-PCR (Flu A&B, Covid) Nasopharyngeal Swab      Status: None   Collection Time: 03/17/21  5:23 PM   Specimen: Nasopharyngeal Swab; Nasopharyngeal(NP) swabs in vial transport medium  Result Value Ref Range Status   SARS Coronavirus 2 by RT PCR NEGATIVE NEGATIVE Final    Comment: (NOTE) SARS-CoV-2 target nucleic acids are NOT DETECTED.  The SARS-CoV-2 RNA is generally detectable in upper respiratory specimens during the acute phase of infection. The lowest concentration of SARS-CoV-2 viral copies this assay can detect is 138 copies/mL. A negative result does not preclude SARS-Cov-2 infection and should not be used as the sole basis for treatment or other patient management decisions. A negative result may occur  with  improper specimen collection/handling, submission of specimen other than nasopharyngeal swab, presence of viral mutation(s) within the areas targeted by this assay, and inadequate number of viral copies(<138 copies/mL). A negative result must be combined with clinical observations, patient history, and epidemiological information. The expected result is Negative.  Fact Sheet for Patients:  BloggerCourse.com  Fact Sheet for Healthcare Providers:  SeriousBroker.it  This test is no t yet approved or cleared by the [place] FDA and  has been authorized for detection and/or diagnosis of SARS-CoV-2 by FDA under an Emergency Use Authorization (EUA). This EUA will remain  in effect (meaning this test can be used) for the duration of the COVID-19 declaration under Section 564(b)(1) of the Act, 21 U.S.C.section 360bbb-3(b)(1), unless the authorization is terminated  or revoked sooner.       Influenza A by PCR NEGATIVE NEGATIVE Final   Influenza B by PCR NEGATIVE NEGATIVE Final    Comment: (NOTE) The Xpert Xpress SARS-CoV-2/FLU/RSV plus assay is intended as an aid in the diagnosis of influenza from Nasopharyngeal swab specimens and should not be used as a sole basis  for treatment. Nasal washings and aspirates are unacceptable for Xpert Xpress SARS-CoV-2/FLU/RSV testing.  Fact Sheet for Patients: BloggerCourse.com  Fact Sheet for Healthcare Providers: SeriousBroker.it  This test is not yet approved or cleared by the [place] FDA and has been authorized for detection and/or diagnosis of SARS-CoV-2 by FDA under an Emergency Use Authorization (EUA). This EUA will remain in effect (meaning this test can be used) for the duration of the COVID-19 declaration under Section 564(b)(1) of the Act, 21 U.S.C. section 360bbb-3(b)(1), unless the authorization is terminated or revoked.  Performed at [facility], [address], [place], [place] [address]   Culture, blood (routine x 2)     Status: None (Preliminary result)   Collection Time: 03/17/21  5:42 PM   Specimen: BLOOD  Result Value Ref Range Status   Specimen Description   Final    BLOOD RIGHT ANTECUBITAL Performed at [facility], [address], [place], [place] [address]    Special Requests   Final    BOTTLES DRAWN AEROBIC AND ANAEROBIC Blood Culture adequate volume Performed at [facility], [address], [place], [place] [address]    Culture   Final    NO GROWTH 3 DAYS Performed at [facility], [address] [place], [place] [address]    Report Status PENDING  Incomplete  Culture, blood (routine x 2)     Status: None (Preliminary result)   Collection Time: 03/17/21  5:57 PM   Specimen: BLOOD  Result Value Ref Range Status   Specimen Description   Final    BLOOD LEFT ANTECUBITAL Performed at [facility], [address], [place], [place] [address]    Special Requests   Final    BOTTLES DRAWN AEROBIC AND ANAEROBIC Blood Culture adequate volume Performed at [facility], [address], [place], [place] [address]    Culture    Final    NO GROWTH 3 DAYS Performed at [facility], [address], [place], [place] [address]    Report Status PENDING  Incomplete  Urine culture     Status: None   Collection Time: 03/17/21 10:13 PM   Specimen: In/Out Cath Urine  Result Value Ref Range Status   Specimen Description   Final    IN/OUT CATH URINE Performed at [facility], [address] [place], [place] [address]    Special Requests   Final    NONE Performed at [facility], [address] [place], [place] [address]    Culture   Final    NO GROWTH Performed at [facility], [address] [place], [place] [address]    Report Status 03/19/2021 FINAL  Final  Culture, sputum-assessment     Status: None   Collection Time: 03/18/21 12:03 AM   Specimen: Sputum  Result Value Ref Range Status   Specimen Description SPU  Final   Special Requests NONE  Final   Sputum evaluation   Final    THIS SPECIMEN IS ACCEPTABLE FOR SPUTUM CULTURE Performed at [facility], [address] [place], [place] [address]    Report Status 03/18/2021 FINAL  Final  Culture, Respiratory w Gram Stain     Status: None   Collection Time: 03/18/21 12:03 AM   Specimen: Sputum  Result Value Ref Range Status   Specimen Description   Final    SPU Performed at [facility], [address] [place], [place] [address]    Special Requests   Final    NONE Reflexed from [id number] Performed at [facility], [address] [place], [place] [address]    Gram Stain   Final    RARE WBC PRESENT, PREDOMINANTLY PMN RARE GRAM NEGATIVE RODS RARE YEAST RARE GRAM POSITIVE RODS    Culture   Final    RARE Normal respiratory flora-no Staph aureus or Pseudomonas seen Performed at [facility], [address] [place], [place] [address]    Report Status 03/20/2021 FINAL  Final     Radiology Studies: No results found.  Scheduled Meds: .  azithromycin  500 mg Oral Daily  . enoxaparin (LOVENOX) injection  40 mg Subcutaneous Q24H  . feeding supplement  237 mL Oral TID BM  . guaiFENesin  1,200 mg Oral BID  . nicotine  21 mg Transdermal Daily  . pantoprazole  40 mg Oral Daily   Continuous Infusions: . cefTRIAXone (ROCEPHIN)  IV 2 g (03/19/21 1713)     LOS: 3 days   Rickey BarbaraStephen Lafe Clerk, MD [facility] Pager On Amion  If 7PM-7AM, please contact night-coverage 03/20/2021, 1:35 PM

## 2021-03-21 ENCOUNTER — Inpatient Hospital Stay (HOSPITAL_COMMUNITY): Payer: Medicaid Other

## 2021-03-21 DIAGNOSIS — F1490 Cocaine use, unspecified, uncomplicated: Secondary | ICD-10-CM

## 2021-03-21 DIAGNOSIS — D649 Anemia, unspecified: Secondary | ICD-10-CM | POA: Diagnosis not present

## 2021-03-21 DIAGNOSIS — R6520 Severe sepsis without septic shock: Secondary | ICD-10-CM | POA: Diagnosis not present

## 2021-03-21 DIAGNOSIS — A419 Sepsis, unspecified organism: Secondary | ICD-10-CM | POA: Diagnosis not present

## 2021-03-21 DIAGNOSIS — E871 Hypo-osmolality and hyponatremia: Secondary | ICD-10-CM

## 2021-03-21 DIAGNOSIS — S025XXB Fracture of tooth (traumatic), initial encounter for open fracture: Secondary | ICD-10-CM | POA: Diagnosis not present

## 2021-03-21 DIAGNOSIS — Z5900 Homelessness unspecified: Secondary | ICD-10-CM

## 2021-03-21 DIAGNOSIS — J189 Pneumonia, unspecified organism: Secondary | ICD-10-CM | POA: Diagnosis not present

## 2021-03-21 MED ORDER — SODIUM CHLORIDE 0.9 % IV SOLN
3.0000 g | Freq: Four times a day (QID) | INTRAVENOUS | Status: DC
  Administered 2021-03-21 – 2021-03-24 (×12): 3 g via INTRAVENOUS
  Filled 2021-03-21 (×3): qty 8
  Filled 2021-03-21 (×2): qty 3
  Filled 2021-03-21 (×2): qty 8
  Filled 2021-03-21 (×3): qty 3
  Filled 2021-03-21 (×2): qty 8
  Filled 2021-03-21: qty 3

## 2021-03-21 MED ORDER — IOHEXOL 300 MG/ML  SOLN
75.0000 mL | Freq: Once | INTRAMUSCULAR | Status: AC | PRN
  Administered 2021-03-21: 75 mL via INTRAVENOUS

## 2021-03-21 NOTE — Progress Notes (Signed)
PROGRESS NOTE    FLYNN GWYN  CNO:[id number] DOB: 1961-12-13 DOA: 03/17/2021 PCP: Patient, No Pcp Per (Inactive)    Brief Narrative:  60 y.o. male with medical history significant for hypertension and tobacco use who presents from jail for evaluation of shortness of breath and productive cough. Patient was admitted for PNA with TB work up given high risk factors  Assessment & Plan:   Principal Problem:   Severe sepsis (HCC) Active Problems:   Right upper lobe pneumonia   Hyponatremia   Normocytic anemia   Severe sepsis due to right upper lobe pneumonia, POA: Presenting with leukocytosis, tachycardia, tachypnea, fever, lactic acidosis, and hypotension.  CXR shows right upper lobe pneumonia.  -Patient is at high risk for TB given his social situation and reports of night sweats, weight loss, in addition to fevers.  Covid and influenza A/B are negative. -Continue on airborne precautions -CT chest reviewed. Findings of airspace consolidation involving the RUL and RML with air bronchograms without significant volume loss -Pt was continued on empiric IV ceftriaxone and azithromycin -Sputum for AFB and NAA TB testing still pending -HIV is neg -On minimal O2 support -Leukocytosis normalized -Appreciate input by ID, AFB testing pending. Have ordered quantiferon gold test -Abx has since been changed to unasyn -Maxillofacial CT ordered by ID, pending  Hyponatremia: Suspect hypovolemic hyponatremia.   Now much improved with IVF -repeat in AM  Normocytic anemia: Hemoglobin 9.1 on admission.  Unclear baseline. -Repeat CBC in AM  Hypokalemia: Remains low today -Will replace  -Repeat bmet in AM  Tobacco use: Nicotine patch had been ordered.  Cocaine abuse -Urine drug screen is pos for cocaine, despite pt's incarcerated status -Will need cessation  DVT prophylaxis: Lovenox subq Code Status: Full Family Communication: Pt in room, family not at bedside  Status is:  Inpatient  Remains inpatient appropriate because:Ongoing diagnostic testing needed not appropriate for outpatient work up and IV treatments appropriate due to intensity of illness or inability to take PO   Dispo: The patient is from: [place]              Anticipated d/c is to: [facility]              Patient currently is not medically stable to d/c.   Difficult to place patient No   Consultants:     Procedures:     Antimicrobials: Anti-infectives (From admission, onward)   Start     Dose/Rate Route Frequency Ordered Stop   03/21/21 1500  Ampicillin-Sulbactam (UNASYN) 3 g in sodium chloride 0.9 % 100 mL IVPB        3 g 200 mL/hr over 30 Minutes Intravenous Every 6 hours 03/21/21 1432     03/20/21 1200  azithromycin (ZITHROMAX) tablet 500 mg  Status:  Discontinued        500 mg Oral Daily 03/20/21 1026 03/21/21 1031   03/17/21 1800  cefTRIAXone (ROCEPHIN) 2 g in sodium chloride 0.9 % 100 mL IVPB  Status:  Discontinued        2 g 200 mL/hr over 30 Minutes Intravenous Every 24 hours 03/17/21 1748 03/21/21 1432   03/17/21 1800  azithromycin (ZITHROMAX) 500 mg in sodium chloride 0.9 % 250 mL IVPB  Status:  Discontinued        500 mg 250 mL/hr over 60 Minutes Intravenous Every 24 hours 03/17/21 1748 03/20/21 1026      Subjective: Complains of R sided chest pains when coughing heavily  Objective: Vitals:   03/20/21 0551  03/20/21 1700 03/20/21 2119 03/21/21 1223  BP: (!) 98/57 100/70 135/80 (!) 116/55  Pulse: 71 94 96 95  Resp: 16 19 18 18   Temp: 98 F (36.7 C) 97.8 F (36.6 C) 99.8 F (37.7 C) 98 F (36.7 C)  TempSrc: Oral Oral Oral Oral  SpO2:  100% 97% 99%  Weight:      Height:        Intake/Output Summary (Last 24 hours) at 03/21/2021 1509 Last data filed at 03/20/2021 2120 Gross per 24 hour  Intake --  Output 750 ml  Net -750 ml   Filed Weights   03/17/21 1710  Weight: 74.8 kg    Examination: General exam: Conversant, in no acute distress Respiratory system: normal  chest rise, coarse on the R lung fileds, no audible wheezing Cardiovascular system: regular rhythm, s1-s2 Gastrointestinal system: Nondistended, nontender, pos BS Central nervous system: No seizures, no tremors Extremities: No cyanosis, no joint deformities Skin: No rashes, no pallor Psychiatry: Affect normal // no auditory hallucinations   Data Reviewed: I have personally reviewed following labs and imaging studies  CBC: Recent Labs  Lab 03/17/21 1713 03/18/21 0405 03/19/21 0415  WBC 20.6* 17.3* 8.9  NEUTROABS 18.2* 15.9*  --   HGB 9.1* 8.2* 8.5*  HCT 26.6* 24.1* 26.1*  MCV 97.4 99.6 101.2*  PLT 427* 394 458*   Basic Metabolic Panel: Recent Labs  Lab 03/17/21 1715 03/18/21 0405 03/19/21 0415  NA 124* 128* 131*  K 3.4* 3.1* 4.5  CL 90* 97* 102  CO2 27 24 22   GLUCOSE 114* 129* 93  BUN 13 11 8   CREATININE 0.79 0.63 0.67  CALCIUM 7.5* 7.1* 7.4*  MG  --  1.7  --   PHOS  --  2.8  --    GFR: Estimated Creatinine Clearance: 96.2 mL/min (by C-G formula based on SCr of 0.67 mg/dL). Liver Function Tests: Recent Labs  Lab 03/17/21 1715 03/18/21 0405 03/19/21 0415  AST 83* 54* 105*  ALT 32 24 40  ALKPHOS 75 56 54  BILITOT 0.8 0.8 0.1*  PROT 6.8 5.4* 5.7*  ALBUMIN 1.7* 1.3* 1.4*   No results for input(s): LIPASE, AMYLASE in the last 168 hours. No results for input(s): AMMONIA in the last 168 hours. Coagulation Profile: Recent Labs  Lab 03/17/21 1749 03/18/21 0405  INR 1.6* 1.5*   Cardiac Enzymes: No results for input(s): CKTOTAL, CKMB, CKMBINDEX, TROPONINI in the last 168 hours. BNP (last 3 results) No results for input(s): PROBNP in the last 8760 hours. HbA1C: No results for input(s): HGBA1C in the last 72 hours. CBG: No results for input(s): GLUCAP in the last 168 hours. Lipid Profile: No results for input(s): CHOL, HDL, LDLCALC, TRIG, CHOLHDL, LDLDIRECT in the last 72 hours. Thyroid Function Tests: No results for input(s): TSH, T4TOTAL, FREET4, T3FREE,  THYROIDAB in the last 72 hours. Anemia Panel: No results for input(s): VITAMINB12, FOLATE, FERRITIN, TIBC, IRON, RETICCTPCT in the last 72 hours. Sepsis Labs: Recent Labs  Lab 03/17/21 1715 03/18/21 0001 03/18/21 0405  PROCALCITON  --   --  2.46  LATICACIDVEN 2.4* 1.1  --     Recent Results (from the past 240 hour(s))  Resp Panel by RT-PCR (Flu A&B, Covid) Nasopharyngeal Swab     Status: None   Collection Time: 03/17/21  5:23 PM   Specimen: Nasopharyngeal Swab; Nasopharyngeal(NP) swabs in vial transport medium  Result Value Ref Range Status   SARS Coronavirus 2 by RT PCR NEGATIVE NEGATIVE Final    Comment: (  NOTE) SARS-CoV-2 target nucleic acids are NOT DETECTED.  The SARS-CoV-2 RNA is generally detectable in upper respiratory specimens during the acute phase of infection. The lowest concentration of SARS-CoV-2 viral copies this assay can detect is 138 copies/mL. A negative result does not preclude SARS-Cov-2 infection and should not be used as the sole basis for treatment or other patient management decisions. A negative result may occur with  improper specimen collection/handling, submission of specimen other than nasopharyngeal swab, presence of viral mutation(s) within the areas targeted by this assay, and inadequate number of viral copies(<138 copies/mL). A negative result must be combined with clinical observations, patient history, and epidemiological information. The expected result is Negative.  Fact Sheet for Patients:  BloggerCourse.comhttps://www.fda.gov/media/152166/download  Fact Sheet for Healthcare Providers:  SeriousBroker.ithttps://www.fda.gov/media/152162/download  This test is no t yet approved or cleared by the [place] FDA and  has been authorized for detection and/or diagnosis of SARS-CoV-2 by FDA under an Emergency Use Authorization (EUA). This EUA will remain  in effect (meaning this test can be used) for the duration of the COVID-19 declaration under Section 564(b)(1) of the  Act, 21 U.S.C.section 360bbb-3(b)(1), unless the authorization is terminated  or revoked sooner.       Influenza A by PCR NEGATIVE NEGATIVE Final   Influenza B by PCR NEGATIVE NEGATIVE Final    Comment: (NOTE) The Xpert Xpress SARS-CoV-2/FLU/RSV plus assay is intended as an aid in the diagnosis of influenza from Nasopharyngeal swab specimens and should not be used as a sole basis for treatment. Nasal washings and aspirates are unacceptable for Xpert Xpress SARS-CoV-2/FLU/RSV testing.  Fact Sheet for Patients: BloggerCourse.comhttps://www.fda.gov/media/152166/download  Fact Sheet for Healthcare Providers: SeriousBroker.ithttps://www.fda.gov/media/152162/download  This test is not yet approved or cleared by the [place] FDA and has been authorized for detection and/or diagnosis of SARS-CoV-2 by FDA under an Emergency Use Authorization (EUA). This EUA will remain in effect (meaning this test can be used) for the duration of the COVID-19 declaration under Section 564(b)(1) of the Act, 21 U.S.C. section 360bbb-3(b)(1), unless the authorization is terminated or revoked.  Performed at [facility], [address] [place], [place] [address]   Culture, blood (routine x 2)     Status: None (Preliminary result)   Collection Time: 03/17/21  5:42 PM   Specimen: BLOOD  Result Value Ref Range Status   Specimen Description   Final    BLOOD RIGHT ANTECUBITAL Performed at [facility], [address] [place], [place] [address]    Special Requests   Final    BOTTLES DRAWN AEROBIC AND ANAEROBIC Blood Culture adequate volume Performed at [facility], [address] [place], [place] [address]    Culture   Final    NO GROWTH 4 DAYS Performed at [facility], [address] [place], [place] [address]    Report Status PENDING  Incomplete  Culture, blood (routine x 2)     Status: None (Preliminary result)   Collection Time: 03/17/21  5:57 PM    Specimen: BLOOD  Result Value Ref Range Status   Specimen Description   Final    BLOOD LEFT ANTECUBITAL Performed at [facility], [address] [place], [place] [address]    Special Requests   Final    BOTTLES DRAWN AEROBIC AND ANAEROBIC Blood Culture adequate volume Performed at [facility], [address] [place], [place] [address]    Culture   Final    NO GROWTH  4 DAYS Performed at [facility], [address] [place], [place] [address]    Report Status PENDING  Incomplete  Urine culture     Status: None   Collection Time: 03/17/21 10:13 PM   Specimen: In/Out Cath Urine  Result Value Ref Range Status   Specimen Description   Final    IN/OUT CATH URINE Performed at [facility], [address], [place], [place] [address]    Special Requests   Final    NONE Performed at [facility], [address], [place], [place] [address]    Culture   Final    NO GROWTH Performed at [facility], [address] [place], [place] [address]    Report Status 03/19/2021 FINAL  Final  Culture, sputum-assessment     Status: None   Collection Time: 03/18/21 12:03 AM   Specimen: Sputum  Result Value Ref Range Status   Specimen Description SPU  Final   Special Requests NONE  Final   Sputum evaluation   Final    THIS SPECIMEN IS ACCEPTABLE FOR SPUTUM CULTURE Performed at [facility], [address], [place], [place] [address]    Report Status 03/18/2021 FINAL  Final  Culture, Respiratory w Gram Stain     Status: None   Collection Time: 03/18/21 12:03 AM   Specimen: Sputum  Result Value Ref Range Status   Specimen Description   Final    SPU Performed at [facility], [address], [place], [place] [address]    Special Requests   Final    NONE Reflexed from [phone] Performed at [facility], [address] [place], [place] [address]     Gram Stain   Final    RARE WBC PRESENT, PREDOMINANTLY PMN RARE GRAM NEGATIVE RODS RARE YEAST RARE GRAM POSITIVE RODS    Culture   Final    RARE Normal respiratory flora-no Staph aureus or Pseudomonas seen Performed at [facility], [address] [place], [place] [address]    Report Status 03/20/2021 FINAL  Final     Radiology Studies: No results found.  Scheduled Meds: . enoxaparin (LOVENOX) injection  40 mg Subcutaneous Q24H  . feeding supplement  237 mL Oral TID BM  . guaiFENesin  1,200 mg Oral BID  . nicotine  21 mg Transdermal Daily  . pantoprazole  40 mg Oral Daily   Continuous Infusions: . ampicillin-sulbactam (UNASYN) IV       LOS: 4 days   Rickey Barbara, MD [facility] Pager On Amion  If 7PM-7AM, please contact night-coverage 03/21/2021, 3:09 PM
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
## 2021-03-22 DIAGNOSIS — E871 Hypo-osmolality and hyponatremia: Secondary | ICD-10-CM | POA: Diagnosis not present

## 2021-03-22 DIAGNOSIS — Z720 Tobacco use: Secondary | ICD-10-CM

## 2021-03-22 DIAGNOSIS — Z5900 Homelessness unspecified: Secondary | ICD-10-CM

## 2021-03-22 DIAGNOSIS — A419 Sepsis, unspecified organism: Secondary | ICD-10-CM | POA: Diagnosis not present

## 2021-03-22 DIAGNOSIS — D649 Anemia, unspecified: Secondary | ICD-10-CM | POA: Diagnosis not present

## 2021-03-22 DIAGNOSIS — R6520 Severe sepsis without septic shock: Secondary | ICD-10-CM | POA: Diagnosis not present

## 2021-03-22 DIAGNOSIS — J189 Pneumonia, unspecified organism: Secondary | ICD-10-CM | POA: Diagnosis not present

## 2021-03-22 LAB — CBC
HCT: 27.1 % — ABNORMAL LOW (ref 39.0–52.0)
Hemoglobin: 9 g/dL — ABNORMAL LOW (ref 13.0–17.0)
MCH: 33.1 pg (ref 26.0–34.0)
MCHC: 33.2 g/dL (ref 30.0–36.0)
MCV: 99.6 fL (ref 80.0–100.0)
Platelets: 557 10*3/uL — ABNORMAL HIGH (ref 150–400)
RBC: 2.72 MIL/uL — ABNORMAL LOW (ref 4.22–5.81)
RDW: 15.1 % (ref 11.5–15.5)
WBC: 8.5 10*3/uL (ref 4.0–10.5)
nRBC: 0 % (ref 0.0–0.2)

## 2021-03-22 LAB — CULTURE, BLOOD (ROUTINE X 2)
Culture: NO GROWTH
Culture: NO GROWTH
Special Requests: ADEQUATE

## 2021-03-22 LAB — ACID FAST SMEAR (AFB, MYCOBACTERIA): Acid Fast Smear: NEGATIVE

## 2021-03-22 LAB — HEPATITIS C ANTIBODY: HCV Ab: NONREACTIVE

## 2021-03-22 LAB — COMPREHENSIVE METABOLIC PANEL
ALT: 50 U/L — ABNORMAL HIGH (ref 0–44)
AST: 69 U/L — ABNORMAL HIGH (ref 15–41)
Albumin: 1.6 g/dL — ABNORMAL LOW (ref 3.5–5.0)
Alkaline Phosphatase: 59 U/L (ref 38–126)
Anion gap: 8 (ref 5–15)
BUN: 11 mg/dL (ref 6–20)
CO2: 24 mmol/L (ref 22–32)
Calcium: 7.7 mg/dL — ABNORMAL LOW (ref 8.9–10.3)
Chloride: 97 mmol/L — ABNORMAL LOW (ref 98–111)
Creatinine, Ser: 0.73 mg/dL (ref 0.61–1.24)
GFR, Estimated: >60 mL/min (ref >60)
Glucose, Bld: 91 mg/dL (ref 70–99)
Potassium: 4.7 mmol/L (ref 3.5–5.1)
Sodium: 129 mmol/L — ABNORMAL LOW (ref 135–145)
Total Bilirubin: 0.3 mg/dL (ref 0.3–1.2)
Total Protein: 6.4 g/dL — ABNORMAL LOW (ref 6.5–8.1)

## 2021-03-22 LAB — HEPATITIS B SURFACE ANTIGEN: Hepatitis B Surface Ag: NONREACTIVE

## 2021-03-22 LAB — HEPATITIS A ANTIBODY, TOTAL: hep A Total Ab: NONREACTIVE

## 2021-03-22 NOTE — Progress Notes (Signed)
PROGRESS NOTE    Henry Harris  ZOX:[id number] DOB: 12/12/1961 DOA: 03/17/2021 PCP: Patient, No Pcp Per (Inactive)    Brief Narrative:  60 y.o. male with medical history significant for hypertension and tobacco use who presents from jail for evaluation of shortness of breath and productive cough. Patient was admitted for PNA with TB work up given high risk factors  Assessment & Plan:   Principal Problem:   Severe sepsis (HCC) Active Problems:   Right upper lobe pneumonia   Hyponatremia   Normocytic anemia   Severe sepsis due to right upper lobe pneumonia, POA: Presenting with leukocytosis, tachycardia, tachypnea, fever, lactic acidosis, and hypotension.  CXR shows right upper lobe pneumonia.  -Patient is at high risk for TB given his social situation and reports of night sweats, weight loss, in addition to fevers.  Covid and influenza A/B are negative. -Continue on airborne precautions -CT chest reviewed. Findings of airspace consolidation involving the RUL and RML with air bronchograms without significant volume loss -Pt was continued on empiric IV ceftriaxone and azithromycin -Sputum for AFB and NAA TB testing still pending -HIV is neg -On minimal O2 support -Leukocytosis normalized -Appreciate input by ID, AFB testing remains pending -Abx has since been changed to unasyn per ID recs -Maxillofacial CT ordered by ID, reviewed. Finding notable for poor dentition without drainable abscess  Hyponatremia: Suspect hypovolemic hyponatremia.   Had improved with IVF -repeat in AM  Normocytic anemia: Hemoglobin 9.1 on admission.  Unclear baseline. Hgb remains stable Cont to follow CBC trends  Hypokalemia: Replaced -cont to follow bmet  Tobacco use: Nicotine patch had been ordered.  Cocaine abuse -Urine drug screen is pos for cocaine, despite pt's incarcerated status -Will need cessation  DVT prophylaxis: Lovenox subq Code Status: Full Family Communication: Pt in  room, family not at bedside  Status is: Inpatient  Remains inpatient appropriate because:Ongoing diagnostic testing needed not appropriate for outpatient work up and IV treatments appropriate due to intensity of illness or inability to take PO   [facility] [facility] [place] [facility]              [facility]              Patient currently is not medically stable to d/c.   Difficult to place patient No   Consultants:   ID  Procedures:     Antimicrobials: Anti-infectives (From admission, onward)   Start     Dose/Rate Route Frequency Ordered Stop   03/21/21 1500  Ampicillin-Sulbactam (UNASYN) 3 g in sodium chloride 0.9 % 100 mL IVPB        3 g 200 mL/hr over 30 Minutes Intravenous Every 6 hours 03/21/21 1432     03/20/21 1200  azithromycin (ZITHROMAX) tablet 500 mg  Status:  Discontinued        500 mg Oral Daily 03/20/21 1026 03/21/21 1031   03/17/21 1800  cefTRIAXone (ROCEPHIN) 2 g in sodium chloride 0.9 % 100 mL IVPB  Status:  Discontinued        2 g 200 mL/hr over 30 Minutes Intravenous Every 24 hours 03/17/21 1748 03/21/21 1432   03/17/21 1800  azithromycin (ZITHROMAX) 500 mg in sodium chloride 0.9 % 250 mL IVPB  Status:  Discontinued        500 mg 250 mL/hr over 60 Minutes Intravenous Every 24 hours 03/17/21 1748 03/20/21 1026      Subjective: Still complaining of coughing  Objective: Vitals:   03/21/21 1223  03/21/21 2051 03/22/21 0558 03/22/21 1257  BP: (!) 116/55 103/66 106/65 110/70  Pulse: 95 88 83 78  Resp: 18 18 18 18   Temp: 98 F (36.7 C) 99.3 F (37.4 C) 98.7 F (37.1 C) 97.9 F (36.6 C)  TempSrc: Oral Oral Oral Oral  SpO2: 99% 96% 92% 99%  Weight:      Height:        Intake/Output Summary (Last 24 hours) at 03/22/2021 1439 Last data filed at 03/22/2021 1345 Gross per 24 hour  Intake 651 ml  Output 1950 ml  Net -1299 ml   Filed Weights   03/17/21 1710  Weight: 74.8 kg    Examination: General exam: Awake, laying in bed, in nad,  actively coughing Respiratory system: Normal respiratory effort, no wheezing Cardiovascular system: regular rate, s1, s2 Gastrointestinal system: Soft, nondistended, positive BS Central nervous system: CN2-12 grossly intact, strength intact Extremities: Perfused, no clubbing Skin: Normal skin turgor, no notable skin lesions seen Psychiatry: Mood normal // no visual hallucinations   Data Reviewed: I have personally reviewed following labs and imaging studies  CBC: Recent Labs  Lab 03/17/21 1713 03/18/21 0405 03/19/21 0415 03/22/21 0343  WBC 20.6* 17.3* 8.9 8.5  NEUTROABS 18.2* 15.9*  --   --   HGB 9.1* 8.2* 8.5* 9.0*  HCT 26.6* 24.1* 26.1* 27.1*  MCV 97.4 99.6 101.2* 99.6  PLT 427* 394 458* 557*   Basic Metabolic Panel: Recent Labs  Lab 03/17/21 1715 03/18/21 0405 03/19/21 0415 03/22/21 0343  NA 124* 128* 131* 129*  K 3.4* 3.1* 4.5 4.7  CL 90* 97* 102 97*  CO2 27 24 22 24   GLUCOSE 114* 129* 93 91  BUN 13 11 8 11   CREATININE 0.79 0.63 0.67 0.73  CALCIUM 7.5* 7.1* 7.4* 7.7*  MG  --  1.7  --   --   PHOS  --  2.8  --   --    GFR: Estimated Creatinine Clearance: 96.2 mL/min (by C-G formula based on SCr of 0.73 mg/dL). Liver Function Tests: Recent Labs  Lab 03/17/21 1715 03/18/21 0405 03/19/21 0415 03/22/21 0343  AST 83* 54* 105* 69*  ALT 32 24 40 50*  ALKPHOS 75 56 54 59  BILITOT 0.8 0.8 0.1* 0.3  PROT 6.8 5.4* 5.7* 6.4*  ALBUMIN 1.7* 1.3* 1.4* 1.6*   No results for input(s): LIPASE, AMYLASE in the last 168 hours. No results for input(s): AMMONIA in the last 168 hours. Coagulation Profile: Recent Labs  Lab 03/17/21 1749 03/18/21 0405  INR 1.6* 1.5*   Cardiac Enzymes: No results for input(s): CKTOTAL, CKMB, CKMBINDEX, TROPONINI in the last 168 hours. BNP (last 3 results) No results for input(s): PROBNP in the last 8760 hours. HbA1C: No results for input(s): HGBA1C in the last 72 hours. CBG: No results for input(s): GLUCAP in the last 168  hours. Lipid Profile: No results for input(s): CHOL, HDL, LDLCALC, TRIG, CHOLHDL, LDLDIRECT in the last 72 hours. Thyroid Function Tests: No results for input(s): TSH, T4TOTAL, FREET4, T3FREE, THYROIDAB in the last 72 hours. Anemia Panel: No results for input(s): VITAMINB12, FOLATE, FERRITIN, TIBC, IRON, RETICCTPCT in the last 72 hours. Sepsis Labs: Recent Labs  Lab 03/17/21 1715 03/18/21 0001 03/18/21 0405  PROCALCITON  --   --  2.46  LATICACIDVEN 2.4* 1.1  --     Recent Results (from the past 240 hour(s))  Resp Panel by RT-PCR (Flu A&B, Covid) Nasopharyngeal Swab     Status: None   Collection Time: 03/17/21  5:23 PM   Specimen: Nasopharyngeal Swab; Nasopharyngeal(NP) swabs in vial transport medium  Result Value Ref Range Status   SARS Coronavirus 2 by RT PCR NEGATIVE NEGATIVE Final    Comment: (NOTE) SARS-CoV-2 target nucleic acids are NOT DETECTED.  The SARS-CoV-2 RNA is generally detectable in upper respiratory specimens during the acute phase of infection. The lowest concentration of SARS-CoV-2 viral copies this assay can detect is 138 copies/mL. A negative result does not preclude SARS-Cov-2 infection and should not be used as the sole basis for treatment or other patient management decisions. A negative result may occur with  improper specimen collection/handling, submission of specimen other than nasopharyngeal swab, presence of viral mutation(s) within the areas targeted by this assay, and inadequate number of viral copies(<138 copies/mL). A negative result must be combined with clinical observations, patient history, and epidemiological information. The expected result is Negative.  Fact Sheet for Patients:  BloggerCourse.com  Fact Sheet for Healthcare Providers:  SeriousBroker.it  This test is no t yet approved or cleared by the [place] FDA and  has been authorized for detection and/or diagnosis of  SARS-CoV-2 by FDA under an Emergency Use Authorization (EUA). This EUA will remain  in effect (meaning this test can be used) for the duration of the COVID-19 declaration under Section 564(b)(1) of the Act, 21 U.S.C.section 360bbb-3(b)(1), unless the authorization is terminated  or revoked sooner.       Influenza A by PCR NEGATIVE NEGATIVE Final   Influenza B by PCR NEGATIVE NEGATIVE Final    Comment: (NOTE) The Xpert Xpress SARS-CoV-2/FLU/RSV plus assay is intended as an aid in the diagnosis of influenza from Nasopharyngeal swab specimens and should not be used as a sole basis for treatment. Nasal washings and aspirates are unacceptable for Xpert Xpress SARS-CoV-2/FLU/RSV testing.  Fact Sheet for Patients: BloggerCourse.com  Fact Sheet for Healthcare Providers: SeriousBroker.it  This test is not yet approved or cleared by the [place] FDA and has been authorized for detection and/or diagnosis of SARS-CoV-2 by FDA under an Emergency Use Authorization (EUA). This EUA will remain in effect (meaning this test can be used) for the duration of the COVID-19 declaration under Section 564(b)(1) of the Act, 21 U.S.C. section 360bbb-3(b)(1), unless the authorization is terminated or revoked.  Performed at [facility], [address], [place], [place] [address]   Culture, blood (routine x 2)     Status: None   Collection Time: 03/17/21  5:42 PM   Specimen: BLOOD  Result Value Ref Range Status   Specimen Description   Final    BLOOD RIGHT ANTECUBITAL Performed at [facility], [address], [place], [place] [address]    Special Requests   Final    BOTTLES DRAWN AEROBIC AND ANAEROBIC Blood Culture adequate volume Performed at [facility], [address], [place], [place] [address]    Culture   Final    NO GROWTH 5 DAYS Performed at [facility], [address], [place], [place] [address]    Report Status 03/22/2021 FINAL  Final  Culture, blood (routine x 2)     Status: None   Collection Time: 03/17/21  5:57 PM   Specimen: BLOOD  Result Value Ref Range Status   Specimen Description   Final    BLOOD LEFT ANTECUBITAL Performed at [facility], [address] [place], [place] [address]    Special Requests   Final    BOTTLES DRAWN AEROBIC  AND ANAEROBIC Blood Culture adequate volume Performed at [facility], [address], [place], [place] [address]    Culture   Final    NO GROWTH 5 DAYS Performed at [facility], [address] [place], [place] [address]    Report Status 03/22/2021 FINAL  Final  Urine culture     Status: None   Collection Time: 03/17/21 10:13 PM   Specimen: In/Out Cath Urine  Result Value Ref Range Status   Specimen Description   Final    IN/OUT CATH URINE Performed at [facility], [address] [place], [place] [address]    Special Requests   Final    NONE Performed at [facility], [address], [place], [place] [address]    Culture   Final    NO GROWTH Performed at [facility], [address], [place], [place] [address]    Report Status 03/19/2021 FINAL  Final  Culture, sputum-assessment     Status: None   Collection Time: 03/18/21 12:03 AM   Specimen: Sputum  Result Value Ref Range Status   Specimen Description SPU  Final   Special Requests NONE  Final   Sputum evaluation   Final    THIS SPECIMEN IS ACCEPTABLE FOR SPUTUM CULTURE Performed at [facility], [address] [place], [place] [address]    Report Status 03/18/2021 FINAL  Final  Culture, Respiratory w Gram Stain     Status: None   Collection Time: 03/18/21 12:03 AM   Specimen: Sputum  Result Value Ref Range Status   Specimen Description   Final    SPU Performed at [facility], [address] [place],  [place] [address]    Special Requests   Final    NONE Reflexed from [id number] Performed at [facility], [address] [place], [place] [address]    Gram Stain   Final    RARE WBC PRESENT, PREDOMINANTLY PMN RARE GRAM NEGATIVE RODS RARE YEAST RARE GRAM POSITIVE RODS    Culture   Final    RARE Normal respiratory flora-no Staph aureus or Pseudomonas seen Performed at [facility], [address] [place], [place] [address]    Report Status 03/20/2021 FINAL  Final     Radiology Studies: CT MAXILLOFACIAL W CONTRAST  Result Date: 03/21/2021 CLINICAL DATA:  Odontogenic abscess EXAM: CT MAXILLOFACIAL WITH CONTRAST TECHNIQUE: Multidetector CT imaging of the maxillofacial structures was performed with intravenous contrast. Multiplanar CT image reconstructions were also generated. CONTRAST:  82mL OMNIPAQUE IOHEXOL 300 MG/ML  SOLN COMPARISON:  None. FINDINGS: Osseous: Diffuse dental disease with multiple dental caries and periapical lucencies there is no abscess or fluid collection. Mild soft tissue swelling adjacent to teeth 17 and 18. Orbits: Negative. No traumatic or inflammatory finding. Sinuses: Clear. Soft tissues: Negative. Limited intracranial: No significant or unexpected finding. IMPRESSION: Diffuse dental disease with multiple dental caries and periapical lucencies. No abscess or fluid collection. Electronically Signed   By: Deatra Robinson M.D.   On: 03/21/2021 19:10    Scheduled Meds: . enoxaparin (LOVENOX) injection  40 mg Subcutaneous Q24H  . feeding supplement  237 mL Oral TID BM  . guaiFENesin  1,200 mg Oral BID  . nicotine  21 mg Transdermal Daily  . pantoprazole  40 mg Oral Daily   Continuous Infusions: . ampicillin-sulbactam (UNASYN) IV 3 g (03/22/21 0907)     LOS: 5 days   Jeannett Senior  Rhona Leavens, MD [facility] Pager On Amion  If 7PM-7AM, please contact night-coverage 03/22/2021, 2:39 PM

## 2021-03-22 NOTE — Progress Notes (Signed)
Subjective: Complaining of a sore tongue   Antibiotics:  Anti-infectives (From admission, onward)   Start     Dose/Rate Route Frequency Ordered Stop   03/21/21 1500  Ampicillin-Sulbactam (UNASYN) 3 g in sodium chloride 0.9 % 100 mL IVPB        3 g 200 mL/hr over 30 Minutes Intravenous Every 6 hours 03/21/21 1432     03/20/21 1200  azithromycin (ZITHROMAX) tablet 500 mg  Status:  Discontinued        500 mg Oral Daily 03/20/21 1026 03/21/21 1031   03/17/21 1800  cefTRIAXone (ROCEPHIN) 2 g in sodium chloride 0.9 % 100 mL IVPB  Status:  Discontinued        2 g 200 mL/hr over 30 Minutes Intravenous Every 24 hours 03/17/21 1748 03/21/21 1432   03/17/21 1800  azithromycin (ZITHROMAX) 500 mg in sodium chloride 0.9 % 250 mL IVPB  Status:  Discontinued        500 mg 250 mL/hr over 60 Minutes Intravenous Every 24 hours 03/17/21 1748 03/20/21 1026      Medications: Scheduled Meds: . enoxaparin (LOVENOX) injection  40 mg Subcutaneous Q24H  . feeding supplement  237 mL Oral TID BM  . guaiFENesin  1,200 mg Oral BID  . nicotine  21 mg Transdermal Daily  . pantoprazole  40 mg Oral Daily   Continuous Infusions: . ampicillin-sulbactam (UNASYN) IV 3 g (03/22/21 1500)   PRN Meds:.acetaminophen **OR** acetaminophen, ibuprofen, iohexol, ondansetron **OR** ondansetron (ZOFRAN) IV, senna-docusate, traZODone    Objective: Weight change:   Intake/Output Summary (Last 24 hours) at 03/22/2021 1534 Last data filed at 03/22/2021 1345 Gross per 24 hour  Intake 651 ml  Output 1950 ml  Net -1299 ml   Blood pressure 110/70, pulse 78, temperature 97.9 F (36.6 C), temperature source Oral, resp. rate 18, height 5\' 8"  (1.727 m), weight 74.8 kg, SpO2 99 %. Temp:  [97.9 F (36.6 C)-99.3 F (37.4 C)] 97.9 F (36.6 C) (04/10 1257) Pulse Rate:  [78-88] 78 (04/10 1257) Resp:  [18] 18 (04/10 1257) BP: (103-110)/(65-70) 110/70 (04/10 1257) SpO2:  [92 %-99 %] 99 % (04/10 1257)  Physical  Exam: Physical Exam Constitutional:      Appearance: He is cachectic. He is ill-appearing.  HENT:     Head: Normocephalic and atraumatic.     Mouth/Throat:     Mouth: Mucous membranes are moist.     Dentition: Abnormal dentition. Dental caries present.     Pharynx: Oropharynx is clear. Uvula midline. No oropharyngeal exudate.   Eyes:     Conjunctiva/sclera: Conjunctivae normal.  Cardiovascular:     Rate and Rhythm: Normal rate and regular rhythm.     Heart sounds: No murmur heard. No friction rub.  Pulmonary:     Effort: Pulmonary effort is normal. No respiratory distress.     Breath sounds: Rhonchi present. No wheezing.  Abdominal:     General: There is no distension.     Palpations: Abdomen is soft. There is no mass.     Tenderness: There is no abdominal tenderness.     Hernia: No hernia is present.  Musculoskeletal:        General: Normal range of motion.     Cervical back: Normal range of motion and neck supple.  Skin:    General: Skin is warm and dry.     Findings: No erythema or rash.  Neurological:     General: No focal deficit present.  Mental Status: He is alert and oriented to person, place, and time.  Psychiatric:        Mood and Affect: Mood normal.        Behavior: Behavior normal.        Thought Content: Thought content normal.        Judgment: Judgment normal.      CBC:    BMET Recent Labs    03/22/21 0343  NA 129*  K 4.7  CL 97*  CO2 24  GLUCOSE 91  BUN 11  CREATININE 0.73  CALCIUM 7.7*     Liver Panel  Recent Labs    03/22/21 0343  PROT 6.4*  ALBUMIN 1.6*  AST 69*  ALT 50*  ALKPHOS 59  BILITOT 0.3       Sedimentation Rate No results for input(s): ESRSEDRATE in the last 72 hours. C-Reactive Protein No results for input(s): CRP in the last 72 hours.  Micro Results: Recent Results (from the past 720 hour(s))  Resp Panel by RT-PCR (Flu A&B, Covid) Nasopharyngeal Swab     Status: None   Collection Time: 03/17/21  5:23  PM   Specimen: Nasopharyngeal Swab; Nasopharyngeal(NP) swabs in vial transport medium  Result Value Ref Range Status   SARS Coronavirus 2 by RT PCR NEGATIVE NEGATIVE Final    Comment: (NOTE) SARS-CoV-2 target nucleic acids are NOT DETECTED.  The SARS-CoV-2 RNA is generally detectable in upper respiratory specimens during the acute phase of infection. The lowest concentration of SARS-CoV-2 viral copies this assay can detect is 138 copies/mL. A negative result does not preclude SARS-Cov-2 infection and should not be used as the sole basis for treatment or other patient management decisions. A negative result may occur with  improper specimen collection/handling, submission of specimen other than nasopharyngeal swab, presence of viral mutation(s) within the areas targeted by this assay, and inadequate number of viral copies(<138 copies/mL). A negative result must be combined with clinical observations, patient history, and epidemiological information. The expected result is Negative.  Fact Sheet for Patients:  BloggerCourse.com  Fact Sheet for Healthcare Providers:  SeriousBroker.it  This test is no t yet approved or cleared by the [place] FDA and  has been authorized for detection and/or diagnosis of SARS-CoV-2 by FDA under an Emergency Use Authorization (EUA). This EUA will remain  in effect (meaning this test can be used) for the duration of the COVID-19 declaration under Section 564(b)(1) of the Act, 21 U.S.C.section 360bbb-3(b)(1), unless the authorization is terminated  or revoked sooner.       Influenza A by PCR NEGATIVE NEGATIVE Final   Influenza B by PCR NEGATIVE NEGATIVE Final    Comment: (NOTE) The Xpert Xpress SARS-CoV-2/FLU/RSV plus assay is intended as an aid in the diagnosis of influenza from Nasopharyngeal swab specimens and should not be used as a sole basis for treatment. Nasal washings and aspirates are  unacceptable for Xpert Xpress SARS-CoV-2/FLU/RSV testing.  Fact Sheet for Patients: BloggerCourse.com  Fact Sheet for Healthcare Providers: SeriousBroker.it  This test is not yet approved or cleared by the [place] FDA and has been authorized for detection and/or diagnosis of SARS-CoV-2 by FDA under an Emergency Use Authorization (EUA). This EUA will remain in effect (meaning this test can be used) for the duration of the COVID-19 declaration under Section 564(b)(1) of the Act, 21 U.S.C. section 360bbb-3(b)(1), unless the authorization is terminated or revoked.  Performed at [facility], [address] [place], [place] [address]   Culture,  blood (routine x 2)     Status: None   Collection Time: 03/17/21  5:42 PM   Specimen: BLOOD  Result Value Ref Range Status   Specimen Description   Final    BLOOD RIGHT ANTECUBITAL Performed at [facility], [address] [place], [place] [address]    Special Requests   Final    BOTTLES DRAWN AEROBIC AND ANAEROBIC Blood Culture adequate volume Performed at [facility], [address] [place], [place] [address]    Culture   Final    NO GROWTH 5 DAYS Performed at [facility], [address] [place], [place] [address]    Report Status 03/22/2021 FINAL  Final  Culture, blood (routine x 2)     Status: None   Collection Time: 03/17/21  5:57 PM   Specimen: BLOOD  Result Value Ref Range Status   Specimen Description   Final    BLOOD LEFT ANTECUBITAL Performed at [facility], [address] [place], [place] [address]    Special Requests   Final    BOTTLES DRAWN AEROBIC AND ANAEROBIC Blood Culture adequate volume Performed at [facility], [address] [place], [place] [address]    Culture   Final    NO GROWTH 5 DAYS Performed at [facility], [address]  [place], [place] [address]    Report Status 03/22/2021 FINAL  Final  Urine culture     Status: None   Collection Time: 03/17/21 10:13 PM   Specimen: In/Out Cath Urine  Result Value Ref Range Status   Specimen Description   Final    IN/OUT CATH URINE Performed at [facility], [address] [place], [place] [address]    Special Requests   Final    NONE Performed at [facility], [address] [place], [place] [address]    Culture   Final    NO GROWTH Performed at [facility], [address] [place], [place] [address]    Report Status 03/19/2021 FINAL  Final  Culture, sputum-assessment     Status: None   Collection Time: 03/18/21 12:03 AM   Specimen: Sputum  Result Value Ref Range Status   Specimen Description SPU  Final   Special Requests NONE  Final   Sputum evaluation   Final    THIS SPECIMEN IS ACCEPTABLE FOR SPUTUM CULTURE Performed at [facility], [address] [place], [place] [address]    Report Status 03/18/2021 FINAL  Final  Culture, Respiratory w Gram Stain     Status: None   Collection Time: 03/18/21 12:03 AM   Specimen: Sputum  Result Value Ref Range Status   Specimen Description   Final    SPU Performed at [facility], [address] [place], [place] [address]    Special Requests   Final    NONE Reflexed from [id number] Performed at [facility], [address] [place], [place] [address]    Gram Stain   Final    RARE WBC PRESENT, PREDOMINANTLY PMN RARE GRAM NEGATIVE RODS RARE YEAST RARE GRAM POSITIVE RODS    Culture   Final    RARE Normal respiratory flora-no Staph aureus or Pseudomonas seen Performed at [facility], [address] [place], [place] [address]    Report Status 03/20/2021 FINAL  Final    Studies/Results: CT MAXILLOFACIAL W CONTRAST  Result Date:  03/21/2021 CLINICAL DATA:  Odontogenic abscess EXAM: CT MAXILLOFACIAL WITH  CONTRAST TECHNIQUE: Multidetector CT imaging of the maxillofacial structures was performed with intravenous contrast. Multiplanar CT image reconstructions were also generated. CONTRAST:  37mL OMNIPAQUE IOHEXOL 300 MG/ML  SOLN COMPARISON:  None. FINDINGS: Osseous: Diffuse dental disease with multiple dental caries and periapical lucencies there is no abscess or fluid collection. Mild soft tissue swelling adjacent to teeth 17 and 18. Orbits: Negative. No traumatic or inflammatory finding. Sinuses: Clear. Soft tissues: Negative. Limited intracranial: No significant or unexpected finding. IMPRESSION: Diffuse dental disease with multiple dental caries and periapical lucencies. No abscess or fluid collection. Electronically Signed   By: Deatra Robinson M.D.   On: 03/21/2021 19:10      Assessment/Plan:  INTERVAL HISTORY: CT maxillofacial did not show clear-cut abscess AFB stains and cultures are still incubate   Principal Problem:   Severe sepsis (HCC) Active Problems:   Right upper lobe pneumonia   Hyponatremia   Normocytic anemia    Henry Harris is a 60 y.o. male with history of cocaine use homelessness recent incarceration with symptoms of cough and weight loss that date back to Valentine's Day of this year now admitted and found to have extensive right upper and right middle lobe infiltrates.  He is being ruled out for tuberculosis  I have still not seen a smear back for AFB but hopefully will be some tomorrow.  Would consider reconsult with bronchoscopy if this would expedite his work-up.  Certainly an odontogenic infection given his terrible dentition could have caused a polymicrobial infection in the upper and middle lobe.  Continue Augmentin   Dr. Earlene Plater will take over the service tomorrow.   LOS: 5 days   Acey Lav 03/22/2021, 3:34 PM

## 2021-03-22 NOTE — Plan of Care (Signed)

## 2021-03-22 NOTE — Progress Notes (Signed)
Took specimen cup into pt room to obtain sputum for AFB culture.  Pt states that he already spit the sputum out about 5 min ago.  Pt explained the importance of using specimen cup to collect specimen.  Pt was in agreement.  Pt did not use previous specimen cup that was currently in the room at the time another specimen cup was brought to the room.

## 2021-03-23 DIAGNOSIS — R6520 Severe sepsis without septic shock: Secondary | ICD-10-CM | POA: Diagnosis not present

## 2021-03-23 DIAGNOSIS — K029 Dental caries, unspecified: Secondary | ICD-10-CM

## 2021-03-23 DIAGNOSIS — J189 Pneumonia, unspecified organism: Secondary | ICD-10-CM | POA: Diagnosis not present

## 2021-03-23 DIAGNOSIS — A419 Sepsis, unspecified organism: Secondary | ICD-10-CM | POA: Diagnosis not present

## 2021-03-23 DIAGNOSIS — D649 Anemia, unspecified: Secondary | ICD-10-CM

## 2021-03-23 DIAGNOSIS — E871 Hypo-osmolality and hyponatremia: Secondary | ICD-10-CM | POA: Diagnosis not present

## 2021-03-23 DIAGNOSIS — Z5900 Homelessness unspecified: Secondary | ICD-10-CM | POA: Diagnosis not present

## 2021-03-23 DIAGNOSIS — R7989 Other specified abnormal findings of blood chemistry: Secondary | ICD-10-CM

## 2021-03-23 LAB — COMPREHENSIVE METABOLIC PANEL
ALT: 58 U/L — ABNORMAL HIGH (ref 0–44)
AST: 80 U/L — ABNORMAL HIGH (ref 15–41)
Albumin: 1.6 g/dL — ABNORMAL LOW (ref 3.5–5.0)
Alkaline Phosphatase: 64 U/L (ref 38–126)
Anion gap: 9 (ref 5–15)
BUN: 10 mg/dL (ref 6–20)
CO2: 22 mmol/L (ref 22–32)
Calcium: 7.8 mg/dL — ABNORMAL LOW (ref 8.9–10.3)
Chloride: 98 mmol/L (ref 98–111)
Creatinine, Ser: 0.6 mg/dL — ABNORMAL LOW (ref 0.61–1.24)
GFR, Estimated: >60 mL/min (ref >60)
Glucose, Bld: 110 mg/dL — ABNORMAL HIGH (ref 70–99)
Potassium: 4 mmol/L (ref 3.5–5.1)
Sodium: 129 mmol/L — ABNORMAL LOW (ref 135–145)
Total Bilirubin: 0.3 mg/dL (ref 0.3–1.2)
Total Protein: 6.3 g/dL — ABNORMAL LOW (ref 6.5–8.1)

## 2021-03-23 LAB — CBC
HCT: 29.4 % — ABNORMAL LOW (ref 39.0–52.0)
Hemoglobin: 9.3 g/dL — ABNORMAL LOW (ref 13.0–17.0)
MCH: 33.2 pg (ref 26.0–34.0)
MCHC: 31.6 g/dL (ref 30.0–36.0)
MCV: 105 fL — ABNORMAL HIGH (ref 80.0–100.0)
Platelets: 596 10*3/uL — ABNORMAL HIGH (ref 150–400)
RBC: 2.8 MIL/uL — ABNORMAL LOW (ref 4.22–5.81)
RDW: 14.8 % (ref 11.5–15.5)
WBC: 7.8 10*3/uL (ref 4.0–10.5)
nRBC: 0 % (ref 0.0–0.2)

## 2021-03-23 LAB — HEPATITIS B SURFACE ANTIBODY, QUANTITATIVE: Hep B S AB Quant (Post): <3.1 m[IU]/mL — ABNORMAL LOW (ref >9.9)

## 2021-03-23 NOTE — Progress Notes (Signed)
[facility]  Date of Admission:  03/17/2021           Reason for visit: Follow up on pneumonia  Current antibiotics: Amp sulbactam 4/9--present   Previous antibiotics: Ceftriaxone 4/5--4/8 Azithromycin 4/5--4/9   ASSESSMENT:    Pneumonia: CT chest with airspace consolidation of RUL and RML with air bronchogram.  Currently on Amp-Sulbactam and improving awaiting TB rule out given social situation and reports of night sweats, weight loss Poor dentition Homelessness Substance use: UDS positive for cocaine Elevated LFTs: HCV, HBV surface Ag negative  PLAN:    Continue Unasyn AFB smear negative x 1.  2nd smear in process.  Needs AFB smear x 3 to rule out.  Continue airborne precautions for now although this seems likely it could be a polymicrobial bacterial infection related to his poor dentition/ondontogenic source Needs dentistry follow up Will follow   Principal Problem:   Severe sepsis (HCC) Active Problems:   Community acquired pneumonia of right upper lobe of lung   Hyponatremia   Normocytic anemia   Tobacco abuse   Homelessness    MEDICATIONS:    Scheduled Meds: . enoxaparin (LOVENOX) injection  40 mg Subcutaneous Q24H  . feeding supplement  237 mL Oral TID BM  . guaiFENesin  1,200 mg Oral BID  . nicotine  21 mg Transdermal Daily  . pantoprazole  40 mg Oral Daily   Continuous Infusions: . ampicillin-sulbactam (UNASYN) IV 3 g (03/23/21 0958)   PRN Meds:.acetaminophen **OR** acetaminophen, ibuprofen, iohexol, ondansetron **OR** ondansetron (ZOFRAN) IV, senna-docusate, traZODone  SUBJECTIVE:   24 hour events:  No acute events  Patient reports breathing is improved and he is currently off oxygen.  No fevers, chills.  Reports long standing dental issues and that his tongue feels "like it was slapped with a 2x4"     OBJECTIVE:   Blood pressure (!) 100/58, pulse 76, temperature 98 F (36.7 C), temperature source Oral,  resp. rate 15, height 5\' 8"  (1.727 m), weight 74.8 kg, SpO2 97 %. Body mass index is 25.09 kg/m.  Physical Exam Constitutional:      General: He is not in acute distress.    Appearance: He is well-developed.  HENT:     Head: Normocephalic and atraumatic.     Mouth/Throat:     Mouth: Mucous membranes are moist.     Pharynx: Oropharynx is clear.     Comments: Dentition is poor with multiple caries.  Eyes:     Extraocular Movements: Extraocular movements intact.     Conjunctiva/sclera: Conjunctivae normal.  Pulmonary:     Effort: Pulmonary effort is normal. No respiratory distress.     Breath sounds: Rhonchi present.  Abdominal:     General: There is no distension.     Palpations: Abdomen is soft.     Tenderness: There is no abdominal tenderness.  Musculoskeletal:     Cervical back: Normal range of motion and neck supple.  Neurological:     General: No focal deficit present.     Mental Status: He is alert and oriented to person, place, and time.  Psychiatric:     Comments: Speech is pressured.       Lab Results: Lab Results  Component Value Date   WBC 7.8 03/23/2021   HGB 9.3 (L) 03/23/2021   HCT 29.4 (L) 03/23/2021   MCV 105.0 (H) 03/23/2021   PLT 596 (H) 03/23/2021    Lab Results  Component Value Date   NA  129 (L) 03/23/2021   K 4.0 03/23/2021   CO2 22 03/23/2021   GLUCOSE 110 (H) 03/23/2021   BUN 10 03/23/2021   CREATININE 0.60 (L) 03/23/2021   CALCIUM 7.8 (L) 03/23/2021   GFRNONAA >60 03/23/2021   GFRAA >60 11/12/2019    Lab Results  Component Value Date   ALT 58 (H) 03/23/2021   AST 80 (H) 03/23/2021   ALKPHOS 64 03/23/2021   BILITOT 0.3 03/23/2021    No results found for: CRP  No results found for: ESRSEDRATE   I have reviewed the micro and lab results in Epic.  Imaging: CT MAXILLOFACIAL W CONTRAST  Result Date: 03/21/2021 CLINICAL DATA:  Odontogenic abscess EXAM: CT MAXILLOFACIAL WITH CONTRAST TECHNIQUE: Multidetector CT imaging of the  maxillofacial structures was performed with intravenous contrast. Multiplanar CT image reconstructions were also generated. CONTRAST:  34mL OMNIPAQUE IOHEXOL 300 MG/ML  SOLN COMPARISON:  None. FINDINGS: Osseous: Diffuse dental disease with multiple dental caries and periapical lucencies there is no abscess or fluid collection. Mild soft tissue swelling adjacent to teeth 17 and 18. Orbits: Negative. No traumatic or inflammatory finding. Sinuses: Clear. Soft tissues: Negative. Limited intracranial: No significant or unexpected finding. IMPRESSION: Diffuse dental disease with multiple dental caries and periapical lucencies. No abscess or fluid collection. Electronically Signed   By: Deatra Robinson M.D.   On: 03/21/2021 19:10     Imaging  independently reviewed in Epic.    Vedia Coffer for Infectious Disease [facility] [phone] pager 03/23/2021, 12:07 PM  I spent greater than 35 minutes with the patient including greater than 50% of time in face to face counsel of the patient and in coordination of their care.

## 2021-03-23 NOTE — Progress Notes (Signed)
PROGRESS NOTE    Henry Harris  KNL:[id number] DOB: 02-06-1961 DOA: 03/17/2021 PCP: Patient, No Pcp Per (Inactive)    Brief Narrative:  60 y.o. male with medical history significant for hypertension and tobacco use who presents from jail for evaluation of shortness of breath and productive cough. Patient was admitted for PNA with TB work up given high risk factors  Assessment & Plan:   Principal Problem:   Severe sepsis (HCC) Active Problems:   Community acquired pneumonia of right upper lobe of lung   Hyponatremia   Normocytic anemia   Tobacco abuse   Homelessness   Severe sepsis due to right upper lobe pneumonia, POA: Presenting with leukocytosis, tachycardia, tachypnea, fever, lactic acidosis, and hypotension.  CXR shows right upper lobe pneumonia.  -Patient is at high risk for TB given his social situation and reports of night sweats, weight loss, in addition to fevers.  Covid and influenza A/B are negative. -Continue on airborne precautions -CT chest reviewed. Findings of airspace consolidation involving the RUL and RML with air bronchograms without significant volume loss -Pt was continued on empiric IV ceftriaxone and azithromycin -Sputum for AFB and NAA TB testing still pending -HIV is neg -On minimal O2 support -Leukocytosis normalized -Appreciate input by ID, AFB testing remains pending, thus far initial afb sputum is neg x1 -Pt is continued on unasyn per ID recs -Maxillofacial CT ordered by ID, reviewed. Finding notable for poor dentition without drainable abscess, will need to f/u with dentist  Hyponatremia: Suspect hypovolemic hyponatremia.   Recently improved with IVF Recheck bmet in AM  Normocytic anemia: Hemoglobin 9.1 on admission.  Unclear baseline. Hgb remains stable and is trending up Cont to follow CBC trends  Hypokalemia: Replaced -repeat bmet in AM  Tobacco use: Nicotine patch had been ordered.  Cocaine abuse -Urine drug screen is pos  for cocaine, despite pt's incarcerated status -Will need cessation  DVT prophylaxis: Lovenox subq Code Status: Full Family Communication: Pt in room, family not at bedside  Status is: Inpatient  Remains inpatient appropriate because:Ongoing diagnostic testing needed not appropriate for outpatient work up and IV treatments appropriate due to intensity of illness or inability to take PO   Dispo: The patient is from: [place]              Anticipated d/c is to: [facility]              Patient currently is not medically stable to d/c.   Difficult to place patient No   Consultants:   ID  Procedures:     Antimicrobials: Anti-infectives (From admission, onward)   Start     Dose/Rate Route Frequency Ordered Stop   03/21/21 1500  Ampicillin-Sulbactam (UNASYN) 3 g in sodium chloride 0.9 % 100 mL IVPB        3 g 200 mL/hr over 30 Minutes Intravenous Every 6 hours 03/21/21 1432     03/20/21 1200  azithromycin (ZITHROMAX) tablet 500 mg  Status:  Discontinued        500 mg Oral Daily 03/20/21 1026 03/21/21 1031   03/17/21 1800  cefTRIAXone (ROCEPHIN) 2 g in sodium chloride 0.9 % 100 mL IVPB  Status:  Discontinued        2 g 200 mL/hr over 30 Minutes Intravenous Every 24 hours 03/17/21 1748 03/21/21 1432   03/17/21 1800  azithromycin (ZITHROMAX) 500 mg in sodium chloride 0.9 % 250 mL IVPB  Status:  Discontinued        500 mg 250  mL/hr over 60 Minutes Intravenous Every 24 hours 03/17/21 1748 03/20/21 1026      Subjective: Complains of continued cough  Objective: Vitals:   03/22/21 1257 03/22/21 2131 03/23/21 0522 03/23/21 1359  BP: 110/70 (!) 105/58 (!) 100/58 108/61  Pulse: 78 83 76 79  Resp: 18 (!) 21 15 20   Temp: 97.9 F (36.6 C) 98.5 F (36.9 C) 98 F (36.7 C) 97.9 F (36.6 C)  TempSrc: Oral Oral Oral Oral  SpO2: 99% 97% 97% 97%  Weight:      Height:        Intake/Output Summary (Last 24 hours) at 03/23/2021 1550 Last data filed at 03/23/2021 1000 Gross per 24 hour  Intake  240 ml  Output 2250 ml  Net -2010 ml   Filed Weights   03/17/21 1710  Weight: 74.8 kg    Examination: General exam: Conversant, in no acute distress Respiratory system: coarse breath sounds on R, no audible wheezing Cardiovascular system: regular rhythm, s1-s2 Gastrointestinal system: Nondistended, nontender, pos BS Central nervous system: No seizures, no tremors Extremities: No cyanosis, no joint deformities Skin: No rashes, no pallor Psychiatry: Affect normal // no auditory hallucinations   Data Reviewed: I have personally reviewed following labs and imaging studies  CBC: Recent Labs  Lab 03/17/21 1713 03/18/21 0405 03/19/21 0415 03/22/21 0343 03/23/21 0402  WBC 20.6* 17.3* 8.9 8.5 7.8  NEUTROABS 18.2* 15.9*  --   --   --   HGB 9.1* 8.2* 8.5* 9.0* 9.3*  HCT 26.6* 24.1* 26.1* 27.1* 29.4*  MCV 97.4 99.6 101.2* 99.6 105.0*  PLT 427* 394 458* 557* 596*   Basic Metabolic Panel: Recent Labs  Lab 03/17/21 1715 03/18/21 0405 03/19/21 0415 03/22/21 0343 03/23/21 0402  NA 124* 128* 131* 129* 129*  K 3.4* 3.1* 4.5 4.7 4.0  CL 90* 97* 102 97* 98  CO2 27 24 22 24 22   GLUCOSE 114* 129* 93 91 110*  BUN 13 11 8 11 10   CREATININE 0.79 0.63 0.67 0.73 0.60*  CALCIUM 7.5* 7.1* 7.4* 7.7* 7.8*  MG  --  1.7  --   --   --   PHOS  --  2.8  --   --   --    GFR: Estimated Creatinine Clearance: 96.2 mL/min (A) (by C-G formula based on SCr of 0.6 mg/dL (L)). Liver Function Tests: Recent Labs  Lab 03/17/21 1715 03/18/21 0405 03/19/21 0415 03/22/21 0343 03/23/21 0402  AST 83* 54* 105* 69* 80*  ALT 32 24 40 50* 58*  ALKPHOS 75 56 54 59 64  BILITOT 0.8 0.8 0.1* 0.3 0.3  PROT 6.8 5.4* 5.7* 6.4* 6.3*  ALBUMIN 1.7* 1.3* 1.4* 1.6* 1.6*   No results for input(s): LIPASE, AMYLASE in the last 168 hours. No results for input(s): AMMONIA in the last 168 hours. Coagulation Profile: Recent Labs  Lab 03/17/21 1749 03/18/21 0405  INR 1.6* 1.5*   Cardiac Enzymes: No results for  input(s): CKTOTAL, CKMB, CKMBINDEX, TROPONINI in the last 168 hours. BNP (last 3 results) No results for input(s): PROBNP in the last 8760 hours. HbA1C: No results for input(s): HGBA1C in the last 72 hours. CBG: No results for input(s): GLUCAP in the last 168 hours. Lipid Profile: No results for input(s): CHOL, HDL, LDLCALC, TRIG, CHOLHDL, LDLDIRECT in the last 72 hours. Thyroid Function Tests: No results for input(s): TSH, T4TOTAL, FREET4, T3FREE, THYROIDAB in the last 72 hours. Anemia Panel: No results for input(s): VITAMINB12, FOLATE, FERRITIN, TIBC, IRON, RETICCTPCT in the  last 72 hours. Sepsis Labs: Recent Labs  Lab 03/17/21 1715 03/18/21 0001 03/18/21 0405  PROCALCITON  --   --  2.46  LATICACIDVEN 2.4* 1.1  --     Recent Results (from the past 240 hour(s))  Resp Panel by RT-PCR (Flu A&B, Covid) Nasopharyngeal Swab     Status: None   Collection Time: 03/17/21  5:23 PM   Specimen: Nasopharyngeal Swab; Nasopharyngeal(NP) swabs in vial transport medium  Result Value Ref Range Status   SARS Coronavirus 2 by RT PCR NEGATIVE NEGATIVE Final    Comment: (NOTE) SARS-CoV-2 target nucleic acids are NOT DETECTED.  The SARS-CoV-2 RNA is generally detectable in upper respiratory specimens during the acute phase of infection. The lowest concentration of SARS-CoV-2 viral copies this assay can detect is 138 copies/mL. A negative result does not preclude SARS-Cov-2 infection and should not be used as the sole basis for treatment or other patient management decisions. A negative result may occur with  improper specimen collection/handling, submission of specimen other than nasopharyngeal swab, presence of viral mutation(s) within the areas targeted by this assay, and inadequate number of viral copies(<138 copies/mL). A negative result must be combined with clinical observations, patient history, and epidemiological information. The expected result is Negative.  Fact Sheet for Patients:   BloggerCourse.com  Fact Sheet for Healthcare Providers:  SeriousBroker.it  This test is no t yet approved or cleared by the [place] FDA and  has been authorized for detection and/or diagnosis of SARS-CoV-2 by FDA under an Emergency Use Authorization (EUA). This EUA will remain  in effect (meaning this test can be used) for the duration of the COVID-19 declaration under Section 564(b)(1) of the Act, 21 U.S.C.section 360bbb-3(b)(1), unless the authorization is terminated  or revoked sooner.       Influenza A by PCR NEGATIVE NEGATIVE Final   Influenza B by PCR NEGATIVE NEGATIVE Final    Comment: (NOTE) The Xpert Xpress SARS-CoV-2/FLU/RSV plus assay is intended as an aid in the diagnosis of influenza from Nasopharyngeal swab specimens and should not be used as a sole basis for treatment. Nasal washings and aspirates are unacceptable for Xpert Xpress SARS-CoV-2/FLU/RSV testing.  Fact Sheet for Patients: BloggerCourse.com  Fact Sheet for Healthcare Providers: SeriousBroker.it  This test is not yet approved or cleared by the [place] FDA and has been authorized for detection and/or diagnosis of SARS-CoV-2 by FDA under an Emergency Use Authorization (EUA). This EUA will remain in effect (meaning this test can be used) for the duration of the COVID-19 declaration under Section 564(b)(1) of the Act, 21 U.S.C. section 360bbb-3(b)(1), unless the authorization is terminated or revoked.  Performed at [facility], [address] [place], [place] [address]   Culture, blood (routine x 2)     Status: None   Collection Time: 03/17/21  5:42 PM   Specimen: BLOOD  Result Value Ref Range Status   Specimen Description   Final    BLOOD RIGHT ANTECUBITAL Performed at [facility], [address] [place], [place] [address]    Special Requests    Final    BOTTLES DRAWN AEROBIC AND ANAEROBIC Blood Culture adequate volume Performed at [facility], [address] [place], [place] [address]    Culture   Final    NO GROWTH 5 DAYS Performed at [facility], [address], [place], [place] [address]    Report Status 03/22/2021 FINAL  Final  Culture, blood (routine x 2)  Status: None   Collection Time: 03/17/21  5:57 PM   Specimen: BLOOD  Result Value Ref Range Status   Specimen Description   Final    BLOOD LEFT ANTECUBITAL Performed at [facility], [address], [place], [place] [address]    Special Requests   Final    BOTTLES DRAWN AEROBIC AND ANAEROBIC Blood Culture adequate volume Performed at [facility], [address], [place], [place] [address]    Culture   Final    NO GROWTH 5 DAYS Performed at [facility], [address], [place], [place] [address]    Report Status 03/22/2021 FINAL  Final  Acid Fast Smear (AFB)     Status: None   Collection Time: 03/17/21  9:45 PM   Specimen: Sputum  Result Value Ref Range Status   AFB Specimen Processing Concentration  Final   Acid Fast Smear Negative  Final    Comment: (NOTE) Performed At: [facility] [address] [place], [place] [id number] Jolene Schimke MD OZ:[id number]    Source (AFB) EXPECTORATED SPUTUM  Final    Comment: Performed at [facility], [address], [place], [place] [address]  Urine culture     Status: None   Collection Time: 03/17/21 10:13 PM   Specimen: In/Out Cath Urine  Result Value Ref Range Status   Specimen Description   Final    IN/OUT CATH URINE Performed at [facility], [address], [place], [place] [address]    Special Requests   Final    NONE Performed at [facility], [address] [place], [place] [address]    Culture   Final    NO GROWTH Performed at [facility], [address], [place], [place] [address]    Report Status 03/19/2021 FINAL  Final  Culture, sputum-assessment     Status: None   Collection Time: 03/18/21 12:03 AM   Specimen: Sputum  Result Value Ref Range Status   Specimen Description SPU  Final   Special Requests NONE  Final   Sputum evaluation   Final    THIS SPECIMEN IS ACCEPTABLE FOR SPUTUM CULTURE Performed at [facility], [address] [place], [place] [address]    Report Status 03/18/2021 FINAL  Final  Culture, Respiratory w Gram Stain     Status: None   Collection Time: 03/18/21 12:03 AM   Specimen: Sputum  Result Value Ref Range Status   Specimen Description   Final    SPU Performed at [facility], [address] [place], [place] [address]    Special Requests   Final    NONE Reflexed from [id number] Performed at [facility], [address], [place], [place] [address]    Gram Stain   Final    RARE WBC PRESENT, PREDOMINANTLY PMN RARE GRAM NEGATIVE RODS RARE YEAST RARE GRAM POSITIVE RODS    Culture   Final    RARE Normal respiratory flora-no Staph aureus or Pseudomonas seen Performed at [facility], [address] [place], [place] [address]    Report Status 03/20/2021 FINAL  Final     Radiology Studies: CT MAXILLOFACIAL W CONTRAST  Result Date: 03/21/2021 CLINICAL DATA:  Odontogenic abscess EXAM: CT MAXILLOFACIAL WITH CONTRAST TECHNIQUE: Multidetector CT imaging of the maxillofacial structures was performed with intravenous contrast. Multiplanar CT image reconstructions were also generated. CONTRAST:  75mL OMNIPAQUE IOHEXOL 300 MG/ML  SOLN  COMPARISON:  None. FINDINGS: Osseous: Diffuse dental disease with multiple dental caries and periapical lucencies there is no abscess or fluid collection. Mild soft tissue swelling adjacent to teeth 17 and 18. Orbits: Negative. No traumatic or inflammatory finding. Sinuses: Clear. Soft tissues: Negative. Limited intracranial:  No significant or unexpected finding. IMPRESSION: Diffuse dental disease with multiple dental caries and periapical lucencies. No abscess or fluid collection. Electronically Signed   By: Deatra Robinson M.D.   On: 03/21/2021 19:10    Scheduled Meds: . enoxaparin (LOVENOX) injection  40 mg Subcutaneous Q24H  . feeding supplement  237 mL Oral TID BM  . guaiFENesin  1,200 mg Oral BID  . nicotine  21 mg Transdermal Daily  . pantoprazole  40 mg Oral Daily   Continuous Infusions: . ampicillin-sulbactam (UNASYN) IV 3 g (03/23/21 1434)     LOS: 6 days   Rickey Barbara, MD [facility] Pager On Amion  If 7PM-7AM, please contact night-coverage 03/23/2021, 3:50 PM

## 2021-03-23 NOTE — Plan of Care (Signed)

## 2021-03-24 DIAGNOSIS — J189 Pneumonia, unspecified organism: Secondary | ICD-10-CM | POA: Diagnosis not present

## 2021-03-24 DIAGNOSIS — Z5900 Homelessness unspecified: Secondary | ICD-10-CM | POA: Diagnosis not present

## 2021-03-24 DIAGNOSIS — A419 Sepsis, unspecified organism: Secondary | ICD-10-CM | POA: Diagnosis not present

## 2021-03-24 DIAGNOSIS — R6520 Severe sepsis without septic shock: Secondary | ICD-10-CM | POA: Diagnosis not present

## 2021-03-24 DIAGNOSIS — R945 Abnormal results of liver function studies: Secondary | ICD-10-CM

## 2021-03-24 DIAGNOSIS — E871 Hypo-osmolality and hyponatremia: Secondary | ICD-10-CM | POA: Diagnosis not present

## 2021-03-24 LAB — CBC
HCT: 28.9 % — ABNORMAL LOW (ref 39.0–52.0)
Hemoglobin: 9 g/dL — ABNORMAL LOW (ref 13.0–17.0)
MCH: 32.7 pg (ref 26.0–34.0)
MCHC: 31.1 g/dL (ref 30.0–36.0)
MCV: 105.1 fL — ABNORMAL HIGH (ref 80.0–100.0)
Platelets: 586 10*3/uL — ABNORMAL HIGH (ref 150–400)
RBC: 2.75 MIL/uL — ABNORMAL LOW (ref 4.22–5.81)
RDW: 14.8 % (ref 11.5–15.5)
WBC: 7.9 10*3/uL (ref 4.0–10.5)
nRBC: 0 % (ref 0.0–0.2)

## 2021-03-24 LAB — COMPREHENSIVE METABOLIC PANEL
ALT: 57 U/L — ABNORMAL HIGH (ref 0–44)
AST: 68 U/L — ABNORMAL HIGH (ref 15–41)
Albumin: 1.8 g/dL — ABNORMAL LOW (ref 3.5–5.0)
Alkaline Phosphatase: 64 U/L (ref 38–126)
Anion gap: 7 (ref 5–15)
BUN: 10 mg/dL (ref 6–20)
CO2: 23 mmol/L (ref 22–32)
Calcium: 7.8 mg/dL — ABNORMAL LOW (ref 8.9–10.3)
Chloride: 101 mmol/L (ref 98–111)
Creatinine, Ser: 0.68 mg/dL (ref 0.61–1.24)
GFR, Estimated: >60 mL/min (ref >60)
Glucose, Bld: 87 mg/dL (ref 70–99)
Potassium: 4.4 mmol/L (ref 3.5–5.1)
Sodium: 131 mmol/L — ABNORMAL LOW (ref 135–145)
Total Bilirubin: 0.2 mg/dL — ABNORMAL LOW (ref 0.3–1.2)
Total Protein: 6.4 g/dL — ABNORMAL LOW (ref 6.5–8.1)

## 2021-03-24 LAB — MTB RIF NAA W/O CULTURE, SPUTUM

## 2021-03-24 MED ORDER — SODIUM CHLORIDE 0.9 % IV SOLN: INTRAVENOUS | Status: AC

## 2021-03-24 MED ORDER — AMOXICILLIN-POT CLAVULANATE 875-125 MG PO TABS
1.0000 | ORAL_TABLET | Freq: Two times a day (BID) | ORAL | Status: AC
  Administered 2021-03-24 – 2021-03-30 (×14): 1 via ORAL
  Filled 2021-03-24 (×14): qty 1

## 2021-03-24 NOTE — Progress Notes (Signed)
PROGRESS NOTE    Henry Harris  ZOX:[id number] DOB: 08/10/1961 DOA: 03/17/2021 PCP: Patient, No Pcp Per (Inactive)    Brief Narrative:  60 y.o. male with medical history significant for hypertension and tobacco use who presents from jail for evaluation of shortness of breath and productive cough. Patient was admitted for PNA with TB work up given high risk factors  Assessment & Plan:   Principal Problem:   Severe sepsis (HCC) Active Problems:   Community acquired pneumonia of right upper lobe of lung   Hyponatremia   Normocytic anemia   Tobacco abuse   Homelessness   Severe sepsis due to right upper lobe pneumonia, POA: Presenting with leukocytosis, tachycardia, tachypnea, fever, lactic acidosis, and hypotension.  CXR shows right upper lobe pneumonia.  -Patient is at high risk for TB given his social situation and reports of night sweats, weight loss, in addition to fevers.  Covid and influenza A/B are negative. -Continue on airborne precautions -CT chest reviewed. Findings of airspace consolidation involving the RUL and RML with air bronchograms without significant volume loss -Pt was continued on empiric IV ceftriaxone and azithromycin -HIV is neg -On minimal O2 support -Leukocytosis normalized -Appreciate input by ID, AFB testing remains pending, thus far initial afb sputum and MTB NAAT is neg x1 -Second AFB is pending -Pt has been transitioned to Augmentin x 7 more days for total of 14 days tx per ID recs -Maxillofacial CT ordered by ID, reviewed. Finding notable for poor dentition without drainable abscess, will need to f/u with dentist  Hyponatremia: Suspect hypovolemic hyponatremia.   Recently improved with IVF.  Clinically still appears dehydrated. Have resumed NS at 75cc/hr  Normocytic anemia: Hemoglobin 9.1 on admission.  Unclear baseline. Hgb remains stable Cont to follow CBC trends  Hypokalemia: Replaced and normalized Repeat bmet in AM  Tobacco  use: Nicotine patch had been ordered.  Cocaine abuse -Urine drug screen is pos for cocaine, despite pt's incarcerated status -Will need cessation  DVT prophylaxis: Lovenox subq Code Status: Full Family Communication: Pt in room, family not at bedside  Status is: Inpatient  Remains inpatient appropriate because:Ongoing diagnostic testing needed not appropriate for outpatient work up and IV treatments appropriate due to intensity of illness or inability to take PO   Dispo: The patient is from: [place]              Anticipated d/c is to: [facility]              Patient currently is not medically stable to d/c.   Difficult to place patient No   Consultants:   ID  Procedures:     Antimicrobials: Anti-infectives (From admission, onward)   Start     Dose/Rate Route Frequency Ordered Stop   03/24/21 1400  amoxicillin-clavulanate (AUGMENTIN) 875-125 MG per tablet 1 tablet        1 tablet Oral Every 12 hours 03/24/21 1347 03/31/21 0959   03/21/21 1500  Ampicillin-Sulbactam (UNASYN) 3 g in sodium chloride 0.9 % 100 mL IVPB  Status:  Discontinued        3 g 200 mL/hr over 30 Minutes Intravenous Every 6 hours 03/21/21 1432 03/24/21 1347   03/20/21 1200  azithromycin (ZITHROMAX) tablet 500 mg  Status:  Discontinued        500 mg Oral Daily 03/20/21 1026 03/21/21 1031   03/17/21 1800  cefTRIAXone (ROCEPHIN) 2 g in sodium chloride 0.9 % 100 mL IVPB  Status:  Discontinued  2 g 200 mL/hr over 30 Minutes Intravenous Every 24 hours 03/17/21 1748 03/21/21 1432   03/17/21 1800  azithromycin (ZITHROMAX) 500 mg in sodium chloride 0.9 % 250 mL IVPB  Status:  Discontinued        500 mg 250 mL/hr over 60 Minutes Intravenous Every 24 hours 03/17/21 1748 03/20/21 1026      Subjective: Reports continued productive cough  Objective: Vitals:   03/23/21 2028 03/23/21 2128 03/24/21 0353 03/24/21 1328  BP: 102/66 101/61 109/66 116/81  Pulse: 85 85 84 86  Resp: 20 16 20 18   Temp: 98.1 F (36.7 C)  98.5 F (36.9 C) 97.9 F (36.6 C) 97.9 F (36.6 C)  TempSrc: Oral Oral Oral Oral  SpO2: 96% 98% 100% 100%  Weight:      Height:        Intake/Output Summary (Last 24 hours) at 03/24/2021 1518 Last data filed at 03/24/2021 05/24/2021 Gross per 24 hour  Intake --  Output 3000 ml  Net -3000 ml   Filed Weights   03/17/21 1710  Weight: 74.8 kg    Examination: General exam: Awake, laying in bed, in nad Respiratory system: actively coughing, coarse breath sounds on R Cardiovascular system: regular rate, s1, s2 Gastrointestinal system: Soft, nondistended, positive BS Central nervous system: CN2-12 grossly intact, strength intact Extremities: Perfused, no clubbing Skin: Normal skin turgor, no notable skin lesions seen Psychiatry: Mood normal // no visual hallucinations    Data Reviewed: I have personally reviewed following labs and imaging studies  CBC: Recent Labs  Lab 03/17/21 1713 03/18/21 0405 03/19/21 0415 03/22/21 0343 03/23/21 0402 03/24/21 0658  WBC 20.6* 17.3* 8.9 8.5 7.8 7.9  NEUTROABS 18.2* 15.9*  --   --   --   --   HGB 9.1* 8.2* 8.5* 9.0* 9.3* 9.0*  HCT 26.6* 24.1* 26.1* 27.1* 29.4* 28.9*  MCV 97.4 99.6 101.2* 99.6 105.0* 105.1*  PLT 427* 394 458* 557* 596* 586*   Basic Metabolic Panel: Recent Labs  Lab 03/18/21 0405 03/19/21 0415 03/22/21 0343 03/23/21 0402 03/24/21 0658  NA 128* 131* 129* 129* 131*  K 3.1* 4.5 4.7 4.0 4.4  CL 97* 102 97* 98 101  CO2 24 22 24 22 23   GLUCOSE 129* 93 91 110* 87  BUN 11 8 11 10 10   CREATININE 0.63 0.67 0.73 0.60* 0.68  CALCIUM 7.1* 7.4* 7.7* 7.8* 7.8*  MG 1.7  --   --   --   --   PHOS 2.8  --   --   --   --    GFR: Estimated Creatinine Clearance: 96.2 mL/min (by C-G formula based on SCr of 0.68 mg/dL). Liver Function Tests: Recent Labs  Lab 03/18/21 0405 03/19/21 0415 03/22/21 0343 03/23/21 0402 03/24/21 0658  AST 54* 105* 69* 80* 68*  ALT 24 40 50* 58* 57*  ALKPHOS 56 54 59 64 64  BILITOT 0.8 0.1* 0.3 0.3  0.2*  PROT 5.4* 5.7* 6.4* 6.3* 6.4*  ALBUMIN 1.3* 1.4* 1.6* 1.6* 1.8*   No results for input(s): LIPASE, AMYLASE in the last 168 hours. No results for input(s): AMMONIA in the last 168 hours. Coagulation Profile: Recent Labs  Lab 03/17/21 1749 03/18/21 0405  INR 1.6* 1.5*   Cardiac Enzymes: No results for input(s): CKTOTAL, CKMB, CKMBINDEX, TROPONINI in the last 168 hours. BNP (last 3 results) No results for input(s): PROBNP in the last 8760 hours. HbA1C: No results for input(s): HGBA1C in the last 72 hours. CBG: No results for  input(s): GLUCAP in the last 168 hours. Lipid Profile: No results for input(s): CHOL, HDL, LDLCALC, TRIG, CHOLHDL, LDLDIRECT in the last 72 hours. Thyroid Function Tests: No results for input(s): TSH, T4TOTAL, FREET4, T3FREE, THYROIDAB in the last 72 hours. Anemia Panel: No results for input(s): VITAMINB12, FOLATE, FERRITIN, TIBC, IRON, RETICCTPCT in the last 72 hours. Sepsis Labs: Recent Labs  Lab 03/17/21 1715 03/18/21 0001 03/18/21 0405  PROCALCITON  --   --  2.46  LATICACIDVEN 2.4* 1.1  --     Recent Results (from the past 240 hour(s))  Resp Panel by RT-PCR (Flu A&B, Covid) Nasopharyngeal Swab     Status: None   Collection Time: 03/17/21  5:23 PM   Specimen: Nasopharyngeal Swab; Nasopharyngeal(NP) swabs in vial transport medium  Result Value Ref Range Status   SARS Coronavirus 2 by RT PCR NEGATIVE NEGATIVE Final    Comment: (NOTE) SARS-CoV-2 target nucleic acids are NOT DETECTED.  The SARS-CoV-2 RNA is generally detectable in upper respiratory specimens during the acute phase of infection. The lowest concentration of SARS-CoV-2 viral copies this assay can detect is 138 copies/mL. A negative result does not preclude SARS-Cov-2 infection and should not be used as the sole basis for treatment or other patient management decisions. A negative result may occur with  improper specimen collection/handling, submission of specimen other than  nasopharyngeal swab, presence of viral mutation(s) within the areas targeted by this assay, and inadequate number of viral copies(<138 copies/mL). A negative result must be combined with clinical observations, patient history, and epidemiological information. The expected result is Negative.  Fact Sheet for Patients:  BloggerCourse.comhttps://www.fda.gov/media/152166/download  Fact Sheet for Healthcare Providers:  SeriousBroker.ithttps://www.fda.gov/media/152162/download  This test is no t yet approved or cleared by the [place] FDA and  has been authorized for detection and/or diagnosis of SARS-CoV-2 by FDA under an Emergency Use Authorization (EUA). This EUA will remain  in effect (meaning this test can be used) for the duration of the COVID-19 declaration under Section 564(b)(1) of the Act, 21 U.S.C.section 360bbb-3(b)(1), unless the authorization is terminated  or revoked sooner.       Influenza A by PCR NEGATIVE NEGATIVE Final   Influenza B by PCR NEGATIVE NEGATIVE Final    Comment: (NOTE) The Xpert Xpress SARS-CoV-2/FLU/RSV plus assay is intended as an aid in the diagnosis of influenza from Nasopharyngeal swab specimens and should not be used as a sole basis for treatment. Nasal washings and aspirates are unacceptable for Xpert Xpress SARS-CoV-2/FLU/RSV testing.  Fact Sheet for Patients: BloggerCourse.comhttps://www.fda.gov/media/152166/download  Fact Sheet for Healthcare Providers: SeriousBroker.ithttps://www.fda.gov/media/152162/download  This test is not yet approved or cleared by the [place] FDA and has been authorized for detection and/or diagnosis of SARS-CoV-2 by FDA under an Emergency Use Authorization (EUA). This EUA will remain in effect (meaning this test can be used) for the duration of the COVID-19 declaration under Section 564(b)(1) of the Act, 21 U.S.C. section 360bbb-3(b)(1), unless the authorization is terminated or revoked.  Performed at [facility], [address] [place], [place] [address]   Culture, blood (routine x 2)     Status: None   Collection Time: 03/17/21  5:42 PM   Specimen: BLOOD  Result Value Ref Range Status   Specimen Description   Final    BLOOD RIGHT ANTECUBITAL Performed at [facility], [address] [place], [place] [address]    Special Requests   Final    BOTTLES DRAWN AEROBIC AND ANAEROBIC Blood Culture adequate volume Performed  at [facility], [address], [place], [place] [address]    Culture   Final    NO GROWTH 5 DAYS Performed at [facility], [address] [place], [place] [address]    Report Status 03/22/2021 FINAL  Final  Culture, blood (routine x 2)     Status: None   Collection Time: 03/17/21  5:57 PM   Specimen: BLOOD  Result Value Ref Range Status   Specimen Description   Final    BLOOD LEFT ANTECUBITAL Performed at [facility], [address] [place], [place] [address]    Special Requests   Final    BOTTLES DRAWN AEROBIC AND ANAEROBIC Blood Culture adequate volume Performed at [facility], [address] [place], [place] [address]    Culture   Final    NO GROWTH 5 DAYS Performed at [facility], [address], [place], [place] [address]    Report Status 03/22/2021 FINAL  Final  Acid Fast Smear (AFB)     Status: None   Collection Time: 03/17/21  9:45 PM   Specimen: Sputum  Result Value Ref Range Status   AFB Specimen Processing Concentration  Final   Acid Fast Smear Negative  Final    Comment: (NOTE) Performed At: [facility] [address] [place], [place] [id number] Jolene Schimke MD IR:[id number]    Source (AFB) EXPECTORATED SPUTUM  Final    Comment: Performed at [facility], [address] [place], [place] [address]  Urine culture     Status: None   Collection Time: 03/17/21 10:13 PM   Specimen: In/Out Cath Urine  Result Value Ref Range Status   Specimen  Description   Final    IN/OUT CATH URINE Performed at [facility], [address] [place], [place] [address]    Special Requests   Final    NONE Performed at [facility], [address], [place], [place] [address]    Culture   Final    NO GROWTH Performed at [facility], [address], [place], [place] [address]    Report Status 03/19/2021 FINAL  Final  Culture, sputum-assessment     Status: None   Collection Time: 03/18/21 12:03 AM   Specimen: Sputum  Result Value Ref Range Status   Specimen Description SPU  Final   Special Requests NONE  Final   Sputum evaluation   Final    THIS SPECIMEN IS ACCEPTABLE FOR SPUTUM CULTURE Performed at [facility], [address] [place], [place] [address]    Report Status 03/18/2021 FINAL  Final  Culture, Respiratory w Gram Stain     Status: None   Collection Time: 03/18/21 12:03 AM   Specimen: Sputum  Result Value Ref Range Status   Specimen Description   Final    SPU Performed at [facility], [address] [place], [place] [address]    Special Requests   Final    NONE Reflexed from [id number] Performed at [facility], [address], [place], [place] [address]    Gram Stain   Final    RARE WBC PRESENT, PREDOMINANTLY PMN RARE GRAM NEGATIVE RODS RARE YEAST RARE GRAM POSITIVE RODS    Culture   Final    RARE Normal respiratory flora-no Staph aureus or Pseudomonas seen Performed at [facility], [address] [place], [place] [address]    Report  Status 03/20/2021 FINAL  Final  MTB RIF NAA w/o Culture, Sputum     Status: None   Collection Time: 03/18/21  2:28 AM  Result Value Ref Range Status   M Tuberculosis Complex Comment NOT DETECTED Final    Comment: (NOTE) Mycobacterium tuberculosis complex (MTBC) NOT detected. Per the College of American Pathologists (CAP) guidelines, a culture must be performed on all samples  regardless of the molecular test result. By ordering this test code, the client has assumed responsibility for the performance of the culture.    Rifampin Comment Susceptible Final    Comment: (NOTE) Because Mycobacterium tuberculosis complex (MTBC) was not detected, no rifampin determination is possible.    AFB Specimen Processing Concentration  Final    Comment: (NOTE) Performed At: [facility] [address] [place], [place] [id number] Jolene Schimke MD XV:[id number]      Radiology Studies: No results found.  Scheduled Meds: . amoxicillin-clavulanate  1 tablet Oral Q12H  . enoxaparin (LOVENOX) injection  40 mg Subcutaneous Q24H  . feeding supplement  237 mL Oral TID BM  . guaiFENesin  1,200 mg Oral BID  . nicotine  21 mg Transdermal Daily  . pantoprazole  40 mg Oral Daily   Continuous Infusions: . sodium chloride 75 mL/hr at 03/24/21 1304     LOS: 7 days   Rickey Barbara, MD [facility] Pager On Amion  If 7PM-7AM, please contact night-coverage 03/24/2021, 3:18 PM

## 2021-03-24 NOTE — Progress Notes (Signed)
[facility]  Date of Admission:  03/17/2021           Reason for visit: Follow up on pneumonia  Current antibiotics: Amp sulbactam 4/9--present   Previous antibiotics: Ceftriaxone 4/5--4/8 Azithromycin 4/5--4/9   ASSESSMENT:    Pneumonia: CT chest with airspace consolidation of RUL and RML with air bronchogram.  Currently on day #7 therapy  and improved while awaiting TB rule out Poor dentition Homelessness Substance use: UDS positive for cocaine Elevated LFTs: HCV, HBV surface Ag negative  PLAN:    Transition to Augmentin and plan for 7 more days of therapy given extensive pneumonia to treat for 14 days total AFB smear negative x 1.  2nd smear still in process.  Ideally will need AFB smear x 3 to rule out.  MTB NAAT also negative x 1.  Continue airborne precautions for now although this seems likely it could be a polymicrobial bacterial infection related to his poor dentition/ondontogenic source Needs dentistry follow up Will follow   Principal Problem:   Severe sepsis (HCC) Active Problems:   Community acquired pneumonia of right upper lobe of lung   Hyponatremia   Normocytic anemia   Tobacco abuse   Homelessness    MEDICATIONS:    Scheduled Meds: . amoxicillin-clavulanate  1 tablet Oral Q12H  . enoxaparin (LOVENOX) injection  40 mg Subcutaneous Q24H  . feeding supplement  237 mL Oral TID BM  . guaiFENesin  1,200 mg Oral BID  . nicotine  21 mg Transdermal Daily  . pantoprazole  40 mg Oral Daily   Continuous Infusions: . sodium chloride 75 mL/hr at 03/24/21 1304   PRN Meds:.acetaminophen **OR** acetaminophen, ibuprofen, iohexol, ondansetron **OR** ondansetron (ZOFRAN) IV, senna-docusate, traZODone  SUBJECTIVE:   24 hour events:  No acute events  Patient reports breathing is improved.  Remains off oxgen and has no new complaints.  Reports still having some cough with sputum production.  No fevers or chills.       OBJECTIVE:   Blood pressure 116/81, pulse 86, temperature 97.9 F (36.6 C), temperature source Oral, resp. rate 18, height 5\' 8"  (1.727 m), weight 74.8 kg, SpO2 100 %. Body mass index is 25.09 kg/m.  Physical Exam Constitutional:      General: He is not in acute distress.    Appearance: He is well-developed.  HENT:     Head: Normocephalic and atraumatic.     Mouth/Throat:     Comments: Dentition is poor with multiple caries.  Eyes:     Extraocular Movements: Extraocular movements intact.     Conjunctiva/sclera: Conjunctivae normal.  Pulmonary:     Effort: Pulmonary effort is normal. No respiratory distress.  Musculoskeletal:     Cervical back: Normal range of motion.  Neurological:     General: No focal deficit present.     Mental Status: He is alert and oriented to person, place, and time.  Psychiatric:        Mood and Affect: Mood normal.        Behavior: Behavior normal.      Lab Results: Lab Results  Component Value Date   WBC 7.9 03/24/2021   HGB 9.0 (L) 03/24/2021   HCT 28.9 (L) 03/24/2021   MCV 105.1 (H) 03/24/2021   PLT 586 (H) 03/24/2021    Lab Results  Component Value Date   NA 131 (L) 03/24/2021   K 4.4 03/24/2021   CO2 23 03/24/2021   GLUCOSE 87 03/24/2021  BUN 10 03/24/2021   CREATININE 0.68 03/24/2021   CALCIUM 7.8 (L) 03/24/2021   GFRNONAA >60 03/24/2021   GFRAA >60 11/12/2019    Lab Results  Component Value Date   ALT 57 (H) 03/24/2021   AST 68 (H) 03/24/2021   ALKPHOS 64 03/24/2021   BILITOT 0.2 (L) 03/24/2021    No results found for: CRP  No results found for: ESRSEDRATE   I have reviewed the micro and lab results in Epic.  Imaging: No results found.   Imaging  independently reviewed in Epic.    Vedia Coffer for Infectious Disease [facility] [phone] pager 03/24/2021, 2:06 PM

## 2021-03-25 DIAGNOSIS — E43 Unspecified severe protein-calorie malnutrition: Secondary | ICD-10-CM | POA: Insufficient documentation

## 2021-03-25 DIAGNOSIS — A419 Sepsis, unspecified organism: Secondary | ICD-10-CM | POA: Diagnosis not present

## 2021-03-25 DIAGNOSIS — Z5900 Homelessness unspecified: Secondary | ICD-10-CM | POA: Diagnosis not present

## 2021-03-25 DIAGNOSIS — J189 Pneumonia, unspecified organism: Secondary | ICD-10-CM | POA: Diagnosis not present

## 2021-03-25 DIAGNOSIS — E871 Hypo-osmolality and hyponatremia: Secondary | ICD-10-CM | POA: Diagnosis not present

## 2021-03-25 LAB — CBC
HCT: 28.3 % — ABNORMAL LOW (ref 39.0–52.0)
Hemoglobin: 8.7 g/dL — ABNORMAL LOW (ref 13.0–17.0)
MCH: 32.3 pg (ref 26.0–34.0)
MCHC: 30.7 g/dL (ref 30.0–36.0)
MCV: 105.2 fL — ABNORMAL HIGH (ref 80.0–100.0)
Platelets: 576 10*3/uL — ABNORMAL HIGH (ref 150–400)
RBC: 2.69 MIL/uL — ABNORMAL LOW (ref 4.22–5.81)
RDW: 14.9 % (ref 11.5–15.5)
WBC: 9 10*3/uL (ref 4.0–10.5)
nRBC: 0 % (ref 0.0–0.2)

## 2021-03-25 LAB — COMPREHENSIVE METABOLIC PANEL
ALT: 53 U/L — ABNORMAL HIGH (ref 0–44)
AST: 48 U/L — ABNORMAL HIGH (ref 15–41)
Albumin: 1.8 g/dL — ABNORMAL LOW (ref 3.5–5.0)
Alkaline Phosphatase: 64 U/L (ref 38–126)
Anion gap: 8 (ref 5–15)
BUN: 10 mg/dL (ref 6–20)
CO2: 24 mmol/L (ref 22–32)
Calcium: 7.9 mg/dL — ABNORMAL LOW (ref 8.9–10.3)
Chloride: 101 mmol/L (ref 98–111)
Creatinine, Ser: 0.58 mg/dL — ABNORMAL LOW (ref 0.61–1.24)
GFR, Estimated: >60 mL/min (ref >60)
Glucose, Bld: 91 mg/dL (ref 70–99)
Potassium: 4.3 mmol/L (ref 3.5–5.1)
Sodium: 133 mmol/L — ABNORMAL LOW (ref 135–145)
Total Bilirubin: 0.1 mg/dL — ABNORMAL LOW (ref 0.3–1.2)
Total Protein: 6.1 g/dL — ABNORMAL LOW (ref 6.5–8.1)

## 2021-03-25 MED ORDER — VITAMIN B-12 1000 MCG PO TABS
1000.0000 ug | ORAL_TABLET | Freq: Every day | ORAL | Status: DC
  Administered 2021-03-25 – 2021-03-31 (×7): 1000 ug via ORAL
  Filled 2021-03-25 (×7): qty 1

## 2021-03-25 MED ORDER — BOOST / RESOURCE BREEZE PO LIQD CUSTOM
1.0000 | Freq: Two times a day (BID) | ORAL | Status: DC
  Administered 2021-03-25 – 2021-03-26 (×2): 1 via ORAL

## 2021-03-25 MED ORDER — VITAMIN B-12 1000 MCG PO TABS: 1000.0000 ug | ORAL_TABLET | Freq: Every day | ORAL | Status: DC

## 2021-03-25 MED ORDER — ENSURE ENLIVE PO LIQD
237.0000 mL | Freq: Two times a day (BID) | ORAL | Status: DC
  Administered 2021-03-25 – 2021-03-28 (×3): 237 mL via ORAL

## 2021-03-25 NOTE — Progress Notes (Signed)
Nutrition Follow-up  DOCUMENTATION CODES:   Severe malnutrition in context of acute illness/injury,Severe malnutrition in context of social or environmental circumstances  INTERVENTION:  - will decrease Ensure Enlive from TID to BID, each supplement provides 350 kcal and 20 grams of protein. - will order Boost Breeze BID, each supplement provides 250 kcal and 9 grams of protein. - weigh patient today.    NUTRITION DIAGNOSIS:   Severe Malnutrition related to social / environmental circumstances,acute illness as evidenced by severe fat depletion,severe muscle depletion. -revised, ongoing  GOAL:   Patient will meet greater than or equal to 90% of their needs -minimally met on average   MONITOR:   PO intake,Supplement acceptance,Labs,Weight trends  ASSESSMENT:   Pt with PMH of HTN and tobacco abuse from jail admitted with severe sepsis due to RUL PNA.  Limited documentation on meal intakes: 85% of breakfast and 100% of lunch on 4/10; 100% of breakfast on 4/11; 100% of breakfast today.  He has been accepting Ensure ~80% of the time offered.   Patient in bed with officer at bedside. Patient is very hard of hearing and often needs officer to repeat to patient what RD has said.   Patient states he has a great appetite but has been feeling frustrated that foods and beverages are at room temperature when he receives them. This is especially frustrating to him when he gets ice cream.   He denies any chewing or swallowing difficulties and denies any abdominal pain/pressure or nausea with PO intakes.   He has not been weighed since admission (4/5). Officer reports that patient was 175 lb when he arrived at jail. Weight on 4/5 was documented as 165 lb but appears to be a stated weight.    Labs reviewed; Na: 133 mmol/l, creatinine: 0.58 mg/dl, Ca: 7.9 mg/dl, LFTs elevated but trending down. Medications reviewed; 40 mg oral protonix/day.    NUTRITION - FOCUSED PHYSICAL EXAM:  Flowsheet  Row Most Recent Value  Upper Arm Region Severe depletion  Thoracic and Lumbar Region Unable to assess  Buccal Region Severe depletion  Temple Region Moderate depletion  Clavicle Bone Region Severe depletion  Clavicle and Acromion Bone Region Severe depletion  Scapular Bone Region Severe depletion  Dorsal Hand Moderate depletion  Patellar Region Severe depletion  Anterior Thigh Region Unable to assess  Posterior Calf Region Severe depletion  Edema (RD Assessment) None  Hair Reviewed  Eyes Reviewed  Mouth Reviewed  Skin Reviewed  Nails Reviewed       Diet Order:   Diet Order            Diet regular Room service appropriate? Yes; Fluid consistency: Thin  Diet effective now                 EDUCATION NEEDS:   Not appropriate for education at this time  Skin:  Skin Assessment: Reviewed RN Assessment  Last BM:  4/12  Height:   Ht Readings from Last 1 Encounters:  03/17/21 $RemoveB'5\' 8"'[id number]$  (1.727 m)    Weight:   Wt Readings from Last 1 Encounters:  03/17/21 74.8 kg     Estimated Nutritional Needs:  Kcal:  1900-2100 Protein:  95-110 grams Fluid:  >1.9 L/day     Jarome Matin, MS, RD, LDN, CNSC Inpatient Clinical Dietitian RD pager # available in AMION  After hours/weekend pager # available in [facility]

## 2021-03-25 NOTE — Progress Notes (Signed)
PROGRESS NOTE  Henry Harris  VJK:[id number] DOB: [birth year] DOA: 03/17/2021 PCP: Patient, No Pcp Per (Inactive)   Brief Narrative: Henry Harris is a 59 y.o. male with a history of HTN, tobacco use   Assessment & Plan: Principal Problem:   Severe sepsis (HCC) Active Problems:   Community acquired pneumonia of right upper lobe of lung   Hyponatremia   Normocytic anemia   Tobacco abuse   Homelessness   Protein-calorie malnutrition, severe  Severe sepsis due to RUL, RML pneumonia: Covid, flu negative.  - Continue airborne precautions and monitoring serial AFB due to high risk for TB. AFB negative 4/5, pending from 4/10, repeating 4/13. MTB NAAT negative x1. Ideally would have 3 negative tests prior to dropping airborne isolation. Pt [facility] jail [facility] discharge.  - Switch CTX, azithromycin > augmentin for total of 14 days per ID.   Poor dentition: Without drainable abscess on maxillofacial CT. - Requires outpatient dentistry follow up.   Macrocytic anemia due to vitamin B12 deficiency:  - Start supplement B12.  Hypokalemia:  - Resolved.   AOCD: Hgb stable.  - Monitor intermittently.   Hyponatremia: Improve  HTN:  - Normotensive without medications at this time.   Tobacco use:  - Cessation counseling provided - Nicotine patch  Cocaine use: +UDS despite incarcerated status. - Cessation counseling provided.   LFT elevation: HCV, HBV surface Ag negative.   Severe protein-calorie malnutrition:  - Dietitian consulted, supplement protein as able.   DVT prophylaxis: Lovenox Code Status: Full Family Communication: None at bedside Disposition Plan:  Status is: Inpatient  Remains inpatient appropriate because:Ongoing diagnostic testing needed not appropriate for outpatient work up   Dispo: The patient is from: [place]              Anticipated d/c is to: [facility]              Patient currently is not medically stable to d/c.   Difficult to place patient No  Consultants:    ID  Procedures:   None  Antimicrobials:  Ceftriaxone, azithromycin 4/5 - 4/9  Unasyn 4/9 - 4/15  Augmentin 4/12 >>  Subjective: Gave another sputum sample. Chest is tight with coughing and short of breath on exertion though these are overall improving. No new symptoms. Still with occasional night sweats as well.   Objective: Vitals:   03/24/21 2003 03/25/21 0556 03/25/21 1502 03/25/21 1540  BP: 111/68 (!) 107/55 117/62   Pulse: 90 81 82   Resp: 18 18 20    Temp: 98.3 F (36.8 C) 98.2 F (36.8 C) 98.5 F (36.9 C)   TempSrc: Oral Oral Oral   SpO2: 97% 97% 98%   Weight:    59.1 kg  Height:        Intake/Output Summary (Last 24 hours) at 03/25/2021 1707 Last data filed at 03/25/2021 1000 Gross per 24 hour  Intake 240 ml  Output 3150 ml  Net -2910 ml   Filed Weights   03/17/21 1710 03/25/21 1540  Weight: 74.8 kg 59.1 kg    Gen: Chronically ill-appearing thin male in no distress Pulm: Non-labored breathing. Clear to auscultation bilaterally.  CV: Regular rate and rhythm. No murmur, rub, or gallop. No JVD, no pedal edema. GI: Abdomen soft, non-tender, non-distended, with normoactive bowel sounds. No organomegaly or masses felt. Ext: Warm, no deformities Skin: No rashes, lesions or ulcers on visualized skin Neuro: Alert and oriented. No focal neurological deficits. Psych: Judgement and insight appear normal. Mood & affect  appropriate.   Data Reviewed: I have personally reviewed following labs and imaging studies  CBC: Recent Labs  Lab 03/19/21 0415 03/22/21 0343 03/23/21 0402 03/24/21 0658 03/25/21 0358  WBC 8.9 8.5 7.8 7.9 9.0  HGB 8.5* 9.0* 9.3* 9.0* 8.7*  HCT 26.1* 27.1* 29.4* 28.9* 28.3*  MCV 101.2* 99.6 105.0* 105.1* 105.2*  PLT 458* 557* 596* 586* 576*   Basic Metabolic Panel: Recent Labs  Lab 03/19/21 0415 03/22/21 0343 03/23/21 0402 03/24/21 0658 03/25/21 0358  NA 131* 129* 129* 131* 133*  K 4.5 4.7 4.0 4.4 4.3  CL 102 97* 98 101 101   CO2 22 24 22 23 24   GLUCOSE 93 91 110* 87 91  BUN 8 11 10 10 10   CREATININE 0.67 0.73 0.60* 0.68 0.58*  CALCIUM 7.4* 7.7* 7.8* 7.8* 7.9*   GFR: Estimated Creatinine Clearance: 83.1 mL/min (A) (by C-G formula based on SCr of 0.58 mg/dL (L)). Liver Function Tests: Recent Labs  Lab 03/19/21 0415 03/22/21 0343 03/23/21 0402 03/24/21 0658 03/25/21 0358  AST 105* 69* 80* 68* 48*  ALT 40 50* 58* 57* 53*  ALKPHOS 54 59 64 64 64  BILITOT 0.1* 0.3 0.3 0.2* 0.1*  PROT 5.7* 6.4* 6.3* 6.4* 6.1*  ALBUMIN 1.4* 1.6* 1.6* 1.8* 1.8*   No results for input(s): LIPASE, AMYLASE in the last 168 hours. No results for input(s): AMMONIA in the last 168 hours. Coagulation Profile: No results for input(s): INR, PROTIME in the last 168 hours. Cardiac Enzymes: No results for input(s): CKTOTAL, CKMB, CKMBINDEX, TROPONINI in the last 168 hours. BNP (last 3 results) No results for input(s): PROBNP in the last 8760 hours. HbA1C: No results for input(s): HGBA1C in the last 72 hours. CBG: No results for input(s): GLUCAP in the last 168 hours. Lipid Profile: No results for input(s): CHOL, HDL, LDLCALC, TRIG, CHOLHDL, LDLDIRECT in the last 72 hours. Thyroid Function Tests: No results for input(s): TSH, T4TOTAL, FREET4, T3FREE, THYROIDAB in the last 72 hours. Anemia Panel: No results for input(s): VITAMINB12, FOLATE, FERRITIN, TIBC, IRON, RETICCTPCT in the last 72 hours. Urine analysis:    Component Value Date/Time   COLORURINE AMBER (A) 03/17/2021 2213   APPEARANCEUR CLEAR 03/17/2021 2213   LABSPEC 1.015 03/17/2021 2213   PHURINE 6.0 03/17/2021 2213   GLUCOSEU NEGATIVE 03/17/2021 2213   HGBUR SMALL (A) 03/17/2021 2213   BILIRUBINUR SMALL (A) 03/17/2021 2213   KETONESUR NEGATIVE 03/17/2021 2213   PROTEINUR NEGATIVE 03/17/2021 2213   NITRITE NEGATIVE 03/17/2021 2213   LEUKOCYTESUR NEGATIVE 03/17/2021 2213   Recent Results (from the past 240 hour(s))  Resp Panel by RT-PCR (Flu A&B, Covid)  Nasopharyngeal Swab     Status: None   Collection Time: 03/17/21  5:23 PM   Specimen: Nasopharyngeal Swab; Nasopharyngeal(NP) swabs in vial transport medium  Result Value Ref Range Status   SARS Coronavirus 2 by RT PCR NEGATIVE NEGATIVE Final    Comment: (NOTE) SARS-CoV-2 target nucleic acids are NOT DETECTED.  The SARS-CoV-2 RNA is generally detectable in upper respiratory specimens during the acute phase of infection. The lowest concentration of SARS-CoV-2 viral copies this assay can detect is 138 copies/mL. A negative result does not preclude SARS-Cov-2 infection and should not be used as the sole basis for treatment or other patient management decisions. A negative result may occur with  improper specimen collection/handling, submission of specimen other than nasopharyngeal swab, presence of viral mutation(s) within the areas targeted by this assay, and inadequate number of viral copies(<138 copies/mL). A negative  result must be combined with clinical observations, patient history, and epidemiological information. The expected result is Negative.  Fact Sheet for Patients:  BloggerCourse.com  Fact Sheet for Healthcare Providers:  SeriousBroker.it  This test is no t yet approved or cleared by the [place] FDA and  has been authorized for detection and/or diagnosis of SARS-CoV-2 by FDA under an Emergency Use Authorization (EUA). This EUA will remain  in effect (meaning this test can be used) for the duration of the COVID-19 declaration under Section 564(b)(1) of the Act, 21 U.S.C.section 360bbb-3(b)(1), unless the authorization is terminated  or revoked sooner.       Influenza A by PCR NEGATIVE NEGATIVE Final   Influenza B by PCR NEGATIVE NEGATIVE Final    Comment: (NOTE) The Xpert Xpress SARS-CoV-2/FLU/RSV plus assay is intended as an aid in the diagnosis of influenza from Nasopharyngeal swab specimens and should not be  used as a sole basis for treatment. Nasal washings and aspirates are unacceptable for Xpert Xpress SARS-CoV-2/FLU/RSV testing.  Fact Sheet for Patients: BloggerCourse.com  Fact Sheet for Healthcare Providers: SeriousBroker.it  This test is not yet approved or cleared by the [place] FDA and has been authorized for detection and/or diagnosis of SARS-CoV-2 by FDA under an Emergency Use Authorization (EUA). This EUA will remain in effect (meaning this test can be used) for the duration of the COVID-19 declaration under Section 564(b)(1) of the Act, 21 U.S.C. section 360bbb-3(b)(1), unless the authorization is terminated or revoked.  Performed at [facility], [address], [place], [place] [address]   Culture, blood (routine x 2)     Status: None   Collection Time: 03/17/21  5:42 PM   Specimen: BLOOD  Result Value Ref Range Status   Specimen Description   Final    BLOOD RIGHT ANTECUBITAL Performed at [facility], [address] [place], [place] [address]    Special Requests   Final    BOTTLES DRAWN AEROBIC AND ANAEROBIC Blood Culture adequate volume Performed at [facility], [address], [place], [place] [address]    Culture   Final    NO GROWTH 5 DAYS Performed at [facility], [address], [place], [place] [address]    Report Status 03/22/2021 FINAL  Final  Culture, blood (routine x 2)     Status: None   Collection Time: 03/17/21  5:57 PM   Specimen: BLOOD  Result Value Ref Range Status   Specimen Description   Final    BLOOD LEFT ANTECUBITAL Performed at [facility], [address], [place], [place] [address]    Special Requests   Final    BOTTLES DRAWN AEROBIC AND ANAEROBIC Blood Culture adequate volume Performed at [facility], [address] [place], [place] [address]    Culture   Final    NO  GROWTH 5 DAYS Performed at [facility], [address], [place], [place] [address]    Report Status 03/22/2021 FINAL  Final  Acid Fast Smear (AFB)     Status: None   Collection Time: 03/17/21  9:45 PM   Specimen: Sputum  Result Value Ref Range Status   AFB Specimen Processing Concentration  Final   Acid Fast Smear Negative  Final    Comment: (NOTE) Performed At: [facility] [address] [place], [place] [id number] Jolene Schimke MD YK:[id number]    Source (AFB) EXPECTORATED SPUTUM  Final    Comment: Performed  at [facility], [address] [place], [place] [address]  Urine culture     Status: None   Collection Time: 03/17/21 10:13 PM   Specimen: In/Out Cath Urine  Result Value Ref Range Status   Specimen Description   Final    IN/OUT CATH URINE Performed at [facility], [address] [place], [place] [address]    Special Requests   Final    NONE Performed at [facility], [address], [place], [place] [address]    Culture   Final    NO GROWTH Performed at [facility], [address], [place], [place] [address]    Report Status 03/19/2021 FINAL  Final  Culture, sputum-assessment     Status: None   Collection Time: 03/18/21 12:03 AM   Specimen: Sputum  Result Value Ref Range Status   Specimen Description SPU  Final   Special Requests NONE  Final   Sputum evaluation   Final    THIS SPECIMEN IS ACCEPTABLE FOR SPUTUM CULTURE Performed at [facility], [address], [place], [place] [address]    Report Status 03/18/2021 FINAL  Final  Culture, Respiratory w Gram Stain     Status: None   Collection Time: 03/18/21 12:03 AM   Specimen: Sputum  Result Value Ref Range Status   Specimen Description   Final    SPU Performed at [facility], [address] [place], [place] [address]    Special Requests   Final    NONE Reflexed from [id number] Performed  at [facility], [address], [place], [place] [address]    Gram Stain   Final    RARE WBC PRESENT, PREDOMINANTLY PMN RARE GRAM NEGATIVE RODS RARE YEAST RARE GRAM POSITIVE RODS    Culture   Final    RARE Normal respiratory flora-no Staph aureus or Pseudomonas seen Performed at [facility], [address], [place], [place] [address]    Report Status 03/20/2021 FINAL  Final  MTB RIF NAA w/o Culture, Sputum     Status: None   Collection Time: 03/18/21  2:28 AM  Result Value Ref Range Status   M Tuberculosis Complex Comment NOT DETECTED Final    Comment: (NOTE) Mycobacterium tuberculosis complex (MTBC) NOT detected. Per the College of American Pathologists (CAP) guidelines, a culture must be performed on all samples regardless of the molecular test result. By ordering this test code, the client has assumed responsibility for the performance of the culture.    Rifampin Comment Susceptible Final    Comment: (NOTE) Because Mycobacterium tuberculosis complex (MTBC) was not detected, no rifampin determination is possible.    AFB Specimen Processing Concentration  Final    Comment: (NOTE) Performed At: [facility] [address] [place], [place] [id number] Jolene Schimke MD DP:[id number]       Radiology Studies: No results found.  Scheduled Meds: . amoxicillin-clavulanate  1 tablet Oral Q12H  . enoxaparin (LOVENOX) injection  40 mg Subcutaneous Q24H  . feeding supplement  1 Container Oral BID BM  . feeding supplement  237 mL Oral BID BM  . guaiFENesin  1,200 mg Oral BID  . nicotine  21 mg Transdermal Daily  . pantoprazole  40 mg Oral Daily   Continuous Infusions:   LOS: 8 days   Time spent: 25 minutes.  Tyrone Nine, MD [facility] [email or web address] 03/25/2021, 5:07 PM

## 2021-03-26 DIAGNOSIS — E871 Hypo-osmolality and hyponatremia: Secondary | ICD-10-CM | POA: Diagnosis not present

## 2021-03-26 DIAGNOSIS — Z5900 Homelessness unspecified: Secondary | ICD-10-CM | POA: Diagnosis not present

## 2021-03-26 DIAGNOSIS — J189 Pneumonia, unspecified organism: Secondary | ICD-10-CM | POA: Diagnosis not present

## 2021-03-26 DIAGNOSIS — A419 Sepsis, unspecified organism: Secondary | ICD-10-CM | POA: Diagnosis not present

## 2021-03-26 LAB — QUANTIFERON-TB GOLD PLUS: QuantiFERON-TB Gold Plus: NEGATIVE

## 2021-03-26 LAB — QUANTIFERON-TB GOLD PLUS (RQFGPL)
QuantiFERON Mitogen Value: 2.45 IU/mL
QuantiFERON Nil Value: 0.22 IU/mL
QuantiFERON TB1 Ag Value: 0.24 IU/mL
QuantiFERON TB2 Ag Value: 0.21 IU/mL

## 2021-03-26 NOTE — Progress Notes (Signed)
PROGRESS NOTE  Henry Harris  BJS:[id number] DOB: 10/26/1961 DOA: 03/17/2021 PCP: Patient, No Pcp Per (Inactive)   Brief Narrative: Henry Harris is a 60 y.o. male with a history of HTN, tobacco use who presented from jail for shortness of breath and productive cough found to have RUL/RML pneumonia, admitted for IV antibiotics. ID was consulted, suspected polymicrobial infection related to poor dentition, guiding 14 days antibiotics. TB rule out is underway and will need to be completed (AFB smears x3) prior to discharge given high risk factors.  Assessment & Plan: Principal Problem:   Severe sepsis (HCC) Active Problems:   Community acquired pneumonia of right upper lobe of lung   Hyponatremia   Normocytic anemia   Tobacco abuse   Homelessness   Protein-calorie malnutrition, severe  Severe sepsis due to RUL, RML pneumonia: Covid, flu negative.  - Continue airborne precautions and monitoring serial AFB due to high risk for TB. AFB negative 4/5, pending (on last check 4/14) from 4/10, repeating 4/13. MTB NAAT negative, quantiferon negative. Ideally would have 3 negative tests prior to dropping airborne isolation. Pt returning to jail at discharge and is at high risk of MTB.  - Switched CTX, azithromycin > continuing augmentin for total of 14 days per ID.   Poor dentition: Without drainable abscess on maxillofacial CT. - Requires outpatient dentistry follow up.   Macrocytic anemia due to vitamin B12 deficiency:  - Start supplementing B12.  Hypokalemia:  - Resolved.   AOCD: Hgb stable.  - Monitor intermittently.   Hyponatremia: Improve  HTN:  - Normotensive without medications at this time.   Tobacco use:  - Cessation counseling provided - Nicotine patch  Cocaine use: +UDS despite incarcerated status. - Cessation counseling provided.   LFT elevation: Improving. HCV, HBV surface Ag negative.   Severe protein-calorie malnutrition:  - Dietitian consulted, supplement protein as  able.   Constipation: Worsened by sedentary status.  - Continue prns as ordered. Abd benign.  DVT prophylaxis: Lovenox Code Status: Full Family Communication: None at bedside Disposition Plan:  Status is: Inpatient  Remains inpatient appropriate because:Ongoing diagnostic testing needed not appropriate for outpatient work up   Dispo: The patient is from: [place]              Anticipated d/c is to: [facility]              Patient currently is not medically stable to d/c.   Difficult to place patient No  Consultants:   ID  Procedures:   None  Antimicrobials:  Ceftriaxone, azithromycin 4/5 - 4/9  Unasyn 4/9 - 4/15  Augmentin 4/12 >>  Subjective: Still with pleuritic right chest pain. Dyspnea improving, cough resolved. Eating better and getting as much exercise as possible given constraints. No fever.   Objective: Vitals:   03/25/21 1502 03/25/21 1540 03/25/21 2048 03/26/21 0610  BP: 117/62  110/61 103/72  Pulse: 82  86 83  Resp: 20  16 18   Temp: 98.5 F (36.9 C)  98.6 F (37 C) 98.5 F (36.9 C)  TempSrc: Oral  Oral Oral  SpO2: 98%  99% 98%  Weight:  59.1 kg    Height:        Intake/Output Summary (Last 24 hours) at 03/26/2021 1103 Last data filed at 03/26/2021 1054 Gross per 24 hour  Intake 840 ml  Output 2950 ml  Net -2110 ml   Filed Weights   03/17/21 1710 03/25/21 1540  Weight: 74.8 kg 59.1 kg   Gen: Chronically  ill-appearing male in no distress HEENT: Poor dentition Pulm: Nonlabored breathing room air. Mild crackles on right. CV: Regular rate and rhythm. No murmur, rub, or gallop. No JVD, no dependent edema. GI: Abdomen soft, non-tender, non-distended, with normoactive bowel sounds.  Ext: Warm, no deformities Skin: No rashes, lesions or ulcers on visualized skin. Neuro: Alert and oriented. Pleasant. No focal neurological deficits. Psych: Judgement and insight appear fair. Mood euthymic & affect congruent. Behavior is appropriate.    Data Reviewed: I  have personally reviewed following labs and imaging studies  CBC: Recent Labs  Lab 03/22/21 0343 03/23/21 0402 03/24/21 0658 03/25/21 0358  WBC 8.5 7.8 7.9 9.0  HGB 9.0* 9.3* 9.0* 8.7*  HCT 27.1* 29.4* 28.9* 28.3*  MCV 99.6 105.0* 105.1* 105.2*  PLT 557* 596* 586* 576*   Basic Metabolic Panel: Recent Labs  Lab 03/22/21 0343 03/23/21 0402 03/24/21 0658 03/25/21 0358  NA 129* 129* 131* 133*  K 4.7 4.0 4.4 4.3  CL 97* 98 101 101  CO2 24 22 23 24   GLUCOSE 91 110* 87 91  BUN 11 10 10 10   CREATININE 0.73 0.60* 0.68 0.58*  CALCIUM 7.7* 7.8* 7.8* 7.9*   GFR: Estimated Creatinine Clearance: 83.1 mL/min (A) (by C-G formula based on SCr of 0.58 mg/dL (L)). Liver Function Tests: Recent Labs  Lab 03/22/21 0343 03/23/21 0402 03/24/21 0658 03/25/21 0358  AST 69* 80* 68* 48*  ALT 50* 58* 57* 53*  ALKPHOS 59 64 64 64  BILITOT 0.3 0.3 0.2* 0.1*  PROT 6.4* 6.3* 6.4* 6.1*  ALBUMIN 1.6* 1.6* 1.8* 1.8*   No results for input(s): LIPASE, AMYLASE in the last 168 hours. No results for input(s): AMMONIA in the last 168 hours. Coagulation Profile: No results for input(s): INR, PROTIME in the last 168 hours. Cardiac Enzymes: No results for input(s): CKTOTAL, CKMB, CKMBINDEX, TROPONINI in the last 168 hours. BNP (last 3 results) No results for input(s): PROBNP in the last 8760 hours. HbA1C: No results for input(s): HGBA1C in the last 72 hours. CBG: No results for input(s): GLUCAP in the last 168 hours. Lipid Profile: No results for input(s): CHOL, HDL, LDLCALC, TRIG, CHOLHDL, LDLDIRECT in the last 72 hours. Thyroid Function Tests: No results for input(s): TSH, T4TOTAL, FREET4, T3FREE, THYROIDAB in the last 72 hours. Anemia Panel: No results for input(s): VITAMINB12, FOLATE, FERRITIN, TIBC, IRON, RETICCTPCT in the last 72 hours. Urine analysis:    Component Value Date/Time   COLORURINE AMBER (A) 03/17/2021 2213   APPEARANCEUR CLEAR 03/17/2021 2213   LABSPEC 1.015 03/17/2021  2213   PHURINE 6.0 03/17/2021 2213   GLUCOSEU NEGATIVE 03/17/2021 2213   HGBUR SMALL (A) 03/17/2021 2213   BILIRUBINUR SMALL (A) 03/17/2021 2213   KETONESUR NEGATIVE 03/17/2021 2213   PROTEINUR NEGATIVE 03/17/2021 2213   NITRITE NEGATIVE 03/17/2021 2213   LEUKOCYTESUR NEGATIVE 03/17/2021 2213   Recent Results (from the past 240 hour(s))  Resp Panel by RT-PCR (Flu A&B, Covid) Nasopharyngeal Swab     Status: None   Collection Time: 03/17/21  5:23 PM   Specimen: Nasopharyngeal Swab; Nasopharyngeal(NP) swabs in vial transport medium  Result Value Ref Range Status   SARS Coronavirus 2 by RT PCR NEGATIVE NEGATIVE Final    Comment: (NOTE) SARS-CoV-2 target nucleic acids are NOT DETECTED.  The SARS-CoV-2 RNA is generally detectable in upper respiratory specimens during the acute phase of infection. The lowest concentration of SARS-CoV-2 viral copies this assay can detect is 138 copies/mL. A negative result does not preclude SARS-Cov-2 infection  and should not be used as the sole basis for treatment or other patient management decisions. A negative result may occur with  improper specimen collection/handling, submission of specimen other than nasopharyngeal swab, presence of viral mutation(s) within the areas targeted by this assay, and inadequate number of viral copies(<138 copies/mL). A negative result must be combined with clinical observations, patient history, and epidemiological information. The expected result is Negative.  Fact Sheet for Patients:  BloggerCourse.com  Fact Sheet for Healthcare Providers:  SeriousBroker.it  This test is no t yet approved or cleared by the [place] FDA and  has been authorized for detection and/or diagnosis of SARS-CoV-2 by FDA under an Emergency Use Authorization (EUA). This EUA will remain  in effect (meaning this test can be used) for the duration of the COVID-19 declaration under Section  564(b)(1) of the Act, 21 U.S.C.section 360bbb-3(b)(1), unless the authorization is terminated  or revoked sooner.       Influenza A by PCR NEGATIVE NEGATIVE Final   Influenza B by PCR NEGATIVE NEGATIVE Final    Comment: (NOTE) The Xpert Xpress SARS-CoV-2/FLU/RSV plus assay is intended as an aid in the diagnosis of influenza from Nasopharyngeal swab specimens and should not be used as a sole basis for treatment. Nasal washings and aspirates are unacceptable for Xpert Xpress SARS-CoV-2/FLU/RSV testing.  Fact Sheet for Patients: BloggerCourse.com  Fact Sheet for Healthcare Providers: SeriousBroker.it  This test is not yet approved or cleared by the [place] FDA and has been authorized for detection and/or diagnosis of SARS-CoV-2 by FDA under an Emergency Use Authorization (EUA). This EUA will remain in effect (meaning this test can be used) for the duration of the COVID-19 declaration under Section 564(b)(1) of the Act, 21 U.S.C. section 360bbb-3(b)(1), unless the authorization is terminated or revoked.  Performed at [facility], [address] [place], [place] [address]   Culture, blood (routine x 2)     Status: None   Collection Time: 03/17/21  5:42 PM   Specimen: BLOOD  Result Value Ref Range Status   Specimen Description   Final    BLOOD RIGHT ANTECUBITAL Performed at [facility], [address] [place], [place] [address]    Special Requests   Final    BOTTLES DRAWN AEROBIC AND ANAEROBIC Blood Culture adequate volume Performed at [facility], [address] [place], [place] [address]    Culture   Final    NO GROWTH 5 DAYS Performed at [facility], [address] [place], [place] [address]    Report Status 03/22/2021 FINAL  Final  Culture, blood (routine x 2)     Status: None   Collection Time: 03/17/21  5:57 PM   Specimen: BLOOD  Result  Value Ref Range Status   Specimen Description   Final    BLOOD LEFT ANTECUBITAL Performed at [facility], [address] [place], [place] [address]    Special Requests   Final    BOTTLES DRAWN AEROBIC AND ANAEROBIC Blood Culture adequate volume Performed at [facility], [address] [place], [place] [address]    Culture   Final    NO GROWTH 5 DAYS Performed at [facility], [address], [place], [place] [address]    Report Status 03/22/2021 FINAL  Final  Acid Fast Smear (AFB)     Status: None   Collection Time: 03/17/21  9:45 PM   Specimen: Sputum  Result Value  Ref Range Status   AFB Specimen Processing Concentration  Final   Acid Fast Smear Negative  Final    Comment: (NOTE) Performed At: [facility] [address] [place], [place] [id number] Jolene SchimkeNagendra Sanjai MD ZH:[id number]:[phone]    Source (AFB) EXPECTORATED SPUTUM  Final    Comment: Performed at [facility], [address] [place], [place] [address]  Urine culture     Status: None   Collection Time: 03/17/21 10:13 PM   Specimen: In/Out Cath Urine  Result Value Ref Range Status   Specimen Description   Final    IN/OUT CATH URINE Performed at [facility], [address] [place], [place] [address]    Special Requests   Final    NONE Performed at [facility], [address] [place], [place] [address]    Culture   Final    NO GROWTH Performed at [facility], [address] [place], [place] [address]    Report Status 03/19/2021 FINAL  Final  Culture, sputum-assessment     Status: None   Collection Time: 03/18/21 12:03 AM   Specimen: Sputum  Result Value Ref Range Status   Specimen Description SPU  Final   Special Requests NONE  Final   Sputum evaluation   Final    THIS SPECIMEN IS ACCEPTABLE FOR SPUTUM CULTURE Performed at [facility], [address] [place], [place]  [address]    Report Status 03/18/2021 FINAL  Final  Culture, Respiratory w Gram Stain     Status: None   Collection Time: 03/18/21 12:03 AM   Specimen: Sputum  Result Value Ref Range Status   Specimen Description   Final    SPU Performed at [facility], [address] [place], [place] [address]    Special Requests   Final    NONE Reflexed from [id number] Performed at [facility], [address] [place], [place] [address]    Gram Stain   Final    RARE WBC PRESENT, PREDOMINANTLY PMN RARE GRAM NEGATIVE RODS RARE YEAST RARE GRAM POSITIVE RODS    Culture   Final    RARE Normal respiratory flora-no Staph aureus or Pseudomonas seen Performed at [facility], [address] [place], [place] [address]    Report Status 03/20/2021 FINAL  Final  MTB RIF NAA w/o Culture, Sputum     Status: None   Collection Time: 03/18/21  2:28 AM  Result Value Ref Range Status   M Tuberculosis Complex Comment NOT DETECTED Final    Comment: (NOTE) Mycobacterium tuberculosis complex (MTBC) NOT detected. Per the College of American Pathologists (CAP) guidelines, a culture must be performed on all samples regardless of the molecular test result. By ordering this test code, the client has assumed responsibility for the performance of the culture.    Rifampin Comment Susceptible Final    Comment: (NOTE) Because Mycobacterium tuberculosis complex (MTBC) was not detected, no rifampin determination is possible.    AFB Specimen Processing Concentration  Final    Comment: (NOTE) Performed At: [facility] [address] [place], [place] [id number] Jolene SchimkeNagendra Sanjai MD ZS:[id number]:[phone]       Radiology Studies: No results found.  Scheduled Meds: . amoxicillin-clavulanate  1 tablet Oral Q12H  . enoxaparin (LOVENOX) injection  40 mg Subcutaneous Q24H  . feeding supplement  1 Container Oral BID BM  . feeding supplement  237 mL Oral BID BM  . guaiFENesin  1,200  mg Oral BID  . nicotine  21 mg Transdermal Daily  . pantoprazole  40 mg Oral Daily  . vitamin B-12  1,000 mcg Oral Daily   Continuous Infusions:   LOS: 9 days   Time spent: 25 minutes.  Tyrone Nine, MD [facility] [email or web address] 03/26/2021, 11:03 AM

## 2021-03-26 NOTE — TOC Progression Note (Signed)
Transition of Care [facility]) - Progression Note    Patient Details  Name: Henry Harris MRN: [id number] Date of Birth: 1961/03/12  Transition of Care [facility]) CM/SW Contact  Geni Bers, RN Phone Number: 03/26/2021, 3:25 PM  Clinical Narrative:     Continue to wait for 3 Negative AFB's before pt may return to Prison related to safety of everyone. TOC will sign off there are no discharge needs at present time.   Expected Discharge Plan: [facility]rections Facility Barriers to Discharge: No Barriers Identified  Expected Discharge Plan and Services Expected Discharge Plan: Corrections Facility                                               Social Determinants of Health (SDOH) Interventions    Readmission Risk Interventions No flowsheet data found.

## 2021-03-27 DIAGNOSIS — R7989 Other specified abnormal findings of blood chemistry: Secondary | ICD-10-CM | POA: Diagnosis not present

## 2021-03-27 DIAGNOSIS — A419 Sepsis, unspecified organism: Secondary | ICD-10-CM | POA: Diagnosis not present

## 2021-03-27 DIAGNOSIS — J189 Pneumonia, unspecified organism: Secondary | ICD-10-CM | POA: Diagnosis not present

## 2021-03-27 DIAGNOSIS — E871 Hypo-osmolality and hyponatremia: Secondary | ICD-10-CM | POA: Diagnosis not present

## 2021-03-27 DIAGNOSIS — K0889 Other specified disorders of teeth and supporting structures: Secondary | ICD-10-CM

## 2021-03-27 DIAGNOSIS — Z5900 Homelessness unspecified: Secondary | ICD-10-CM | POA: Diagnosis not present

## 2021-03-27 DIAGNOSIS — F1990 Other psychoactive substance use, unspecified, uncomplicated: Secondary | ICD-10-CM

## 2021-03-27 NOTE — Progress Notes (Signed)
PROGRESS NOTE  Henry Harris  YQM:[id number] DOB: 03/13/1961 DOA: 03/17/2021 PCP: Patient, No Pcp Per (Inactive)   Brief Narrative: Henry Harris is a 60 y.o. male with a history of HTN, tobacco use who presented from jail for shortness of breath and productive cough found to have RUL/RML pneumonia, admitted for IV antibiotics. ID was consulted, suspected polymicrobial infection related to poor dentition, guiding 14 days antibiotics. TB rule out is underway and will need to be completed (AFB smears x3) prior to discharge given high risk factors.  Assessment & Plan: Principal Problem:   Severe sepsis (HCC) Active Problems:   Community acquired pneumonia of right upper lobe of lung   Hyponatremia   Normocytic anemia   Tobacco abuse   Homelessness   Protein-calorie malnutrition, severe  Severe sepsis due to RUL, RML pneumonia: Covid, flu negative.  - Continue airborne precautions and monitoring serial AFB due to high risk for TB. AFB negative 4/5, pending (on last check 4/15) from 4/10, repeating 4/13. MTB NAAT negative, quantiferon negative. Ideally would have 3 negative tests prior to dropping airborne isolation. Pt returning to jail at discharge and is at high risk of MTB.  - Switched CTX, azithromycin > continuing augmentin for total of 14 days per ID.   Poor dentition: Without drainable abscess on maxillofacial CT. - Requires outpatient dentistry follow up.   Macrocytic anemia due to vitamin B12 deficiency:  - Start supplementing B12.  Hypokalemia:  - Resolved.   AOCD: Hgb stable.  - Monitor intermittently.   Hyponatremia: Improve  HTN:  - Normotensive without medications at this time.   Tobacco use:  - Cessation counseling provided - Nicotine patch  Cocaine use: +UDS despite incarcerated status. - Cessation counseling provided.   LFT elevation: Improving. HCV, HBV surface Ag negative.   Severe protein-calorie malnutrition:  - Dietitian consulted, supplement protein as  able.   Constipation: Worsened by sedentary status.  - Continue prns as ordered. Abd benign.  DVT prophylaxis: Lovenox Code Status: Full Family Communication: None at bedside Disposition Plan:  Status is: Inpatient  Remains inpatient appropriate because:Ongoing diagnostic testing needed not appropriate for outpatient work up   Dispo: The patient is from: [place]              Anticipated d/c is to: [facility]              Patient currently is not medically stable to d/c.   Difficult to place patient No  Consultants:   ID  Procedures:   None  Antimicrobials:  Ceftriaxone, azithromycin 4/5 - 4/9  Unasyn 4/9 - 4/15  Augmentin 4/12 >>  Subjective: Still with moderate cough with some production but overall improving, associated with right-predominant chest pain. No fevers or other new symptoms.  Objective: Vitals:   03/26/21 0610 03/26/21 1216 03/26/21 2130 03/27/21 0540  BP: 103/72 109/63 102/62 114/78  Pulse: 83 83 82 79  Resp: 18 (!) Temp: 98.5 F (36.9 C) 97.6 F (36.4 C) 97.8 F (36.6 C) 98 F (36.7 C)  TempSrc: Oral Oral Oral Oral  SpO2: 98% 96% 97% 98%  Weight:      Height:        Intake/Output Summary (Last 24 hours) at 03/27/2021 1101 Last data filed at 03/27/2021 0930 Gross per 24 hour  Intake 1560 ml  Output 3550 ml  Net -1990 ml   Filed Weights   03/17/21 1710 03/25/21 1540  Weight: 74.8 kg 59.1 kg   Gen: Incarcerated  chronically ill-appearing male in no distress Pulm: Nonlabored breathing room air. Clear. CV: Regular rate and rhythm. No murmur, rub, or gallop. No JVD, no dependent edema. GI: Abdomen soft, non-tender, non-distended, with normoactive bowel sounds.  Ext: Warm, no deformities Skin: No rashes, lesions or ulcers on visualized skin. Neuro: Alert and oriented. No focal neurological deficits. Psych: Judgement and insight appear fair. Mood euthymic & affect congruent. Behavior is appropriate.      Data Reviewed: I have personally  reviewed following labs and imaging studies  CBC: Recent Labs  Lab 03/22/21 0343 03/23/21 0402 03/24/21 0658 03/25/21 0358  WBC 8.5 7.8 7.9 9.0  HGB 9.0* 9.3* 9.0* 8.7*  HCT 27.1* 29.4* 28.9* 28.3*  MCV 99.6 105.0* 105.1* 105.2*  PLT 557* 596* 586* 576*   Basic Metabolic Panel: Recent Labs  Lab 03/22/21 0343 03/23/21 0402 03/24/21 0658 03/25/21 0358  NA 129* 129* 131* 133*  K 4.7 4.0 4.4 4.3  CL 97* 98 101 101  CO2 24 22 23 24   GLUCOSE 91 110* 87 91  BUN 11 10 10 10   CREATININE 0.73 0.60* 0.68 0.58*  CALCIUM 7.7* 7.8* 7.8* 7.9*   GFR: Estimated Creatinine Clearance: 83.1 mL/min (A) (by C-G formula based on SCr of 0.58 mg/dL (L)). Liver Function Tests: Recent Labs  Lab 03/22/21 0343 03/23/21 0402 03/24/21 0658 03/25/21 0358  AST 69* 80* 68* 48*  ALT 50* 58* 57* 53*  ALKPHOS 59 64 64 64  BILITOT 0.3 0.3 0.2* 0.1*  PROT 6.4* 6.3* 6.4* 6.1*  ALBUMIN 1.6* 1.6* 1.8* 1.8*   No results for input(s): LIPASE, AMYLASE in the last 168 hours. No results for input(s): AMMONIA in the last 168 hours. Coagulation Profile: No results for input(s): INR, PROTIME in the last 168 hours. Cardiac Enzymes: No results for input(s): CKTOTAL, CKMB, CKMBINDEX, TROPONINI in the last 168 hours. BNP (last 3 results) No results for input(s): PROBNP in the last 8760 hours. HbA1C: No results for input(s): HGBA1C in the last 72 hours. CBG: No results for input(s): GLUCAP in the last 168 hours. Lipid Profile: No results for input(s): CHOL, HDL, LDLCALC, TRIG, CHOLHDL, LDLDIRECT in the last 72 hours. Thyroid Function Tests: No results for input(s): TSH, T4TOTAL, FREET4, T3FREE, THYROIDAB in the last 72 hours. Anemia Panel: No results for input(s): VITAMINB12, FOLATE, FERRITIN, TIBC, IRON, RETICCTPCT in the last 72 hours. Urine analysis:    Component Value Date/Time   COLORURINE AMBER (A) 03/17/2021 2213   APPEARANCEUR CLEAR 03/17/2021 2213   LABSPEC 1.015 03/17/2021 2213   PHURINE  6.0 03/17/2021 2213   GLUCOSEU NEGATIVE 03/17/2021 2213   HGBUR SMALL (A) 03/17/2021 2213   BILIRUBINUR SMALL (A) 03/17/2021 2213   KETONESUR NEGATIVE 03/17/2021 2213   PROTEINUR NEGATIVE 03/17/2021 2213   NITRITE NEGATIVE 03/17/2021 2213   LEUKOCYTESUR NEGATIVE 03/17/2021 2213   Recent Results (from the past 240 hour(s))  Resp Panel by RT-PCR (Flu A&B, Covid) Nasopharyngeal Swab     Status: None   Collection Time: 03/17/21  5:23 PM   Specimen: Nasopharyngeal Swab; Nasopharyngeal(NP) swabs in vial transport medium  Result Value Ref Range Status   SARS Coronavirus 2 by RT PCR NEGATIVE NEGATIVE Final    Comment: (NOTE) SARS-CoV-2 target nucleic acids are NOT DETECTED.  The SARS-CoV-2 RNA is generally detectable in upper respiratory specimens during the acute phase of infection. The lowest concentration of SARS-CoV-2 viral copies this assay can detect is 138 copies/mL. A negative result does not preclude SARS-Cov-2 infection and should not be  used as the sole basis for treatment or other patient management decisions. A negative result may occur with  improper specimen collection/handling, submission of specimen other than nasopharyngeal swab, presence of viral mutation(s) within the areas targeted by this assay, and inadequate number of viral copies(<138 copies/mL). A negative result must be combined with clinical observations, patient history, and epidemiological information. The expected result is Negative.  Fact Sheet for Patients:  BloggerCourse.com  Fact Sheet for Healthcare Providers:  SeriousBroker.it  This test is no t yet approved or cleared by the [place] FDA and  has been authorized for detection and/or diagnosis of SARS-CoV-2 by FDA under an Emergency Use Authorization (EUA). This EUA will remain  in effect (meaning this test can be used) for the duration of the COVID-19 declaration under Section 564(b)(1) of the  Act, 21 U.S.C.section 360bbb-3(b)(1), unless the authorization is terminated  or revoked sooner.       Influenza A by PCR NEGATIVE NEGATIVE Final   Influenza B by PCR NEGATIVE NEGATIVE Final    Comment: (NOTE) The Xpert Xpress SARS-CoV-2/FLU/RSV plus assay is intended as an aid in the diagnosis of influenza from Nasopharyngeal swab specimens and should not be used as a sole basis for treatment. Nasal washings and aspirates are unacceptable for Xpert Xpress SARS-CoV-2/FLU/RSV testing.  Fact Sheet for Patients: BloggerCourse.com  Fact Sheet for Healthcare Providers: SeriousBroker.it  This test is not yet approved or cleared by the [place] FDA and has been authorized for detection and/or diagnosis of SARS-CoV-2 by FDA under an Emergency Use Authorization (EUA). This EUA will remain in effect (meaning this test can be used) for the duration of the COVID-19 declaration under Section 564(b)(1) of the Act, 21 U.S.C. section 360bbb-3(b)(1), unless the authorization is terminated or revoked.  Performed at [facility], [address], [place], [place] [address]   Culture, blood (routine x 2)     Status: None   Collection Time: 03/17/21  5:42 PM   Specimen: BLOOD  Result Value Ref Range Status   Specimen Description   Final    BLOOD RIGHT ANTECUBITAL Performed at [facility], [address] [place], [place] [address]    Special Requests   Final    BOTTLES DRAWN AEROBIC AND ANAEROBIC Blood Culture adequate volume Performed at [facility], [address], [place], [place] [address]    Culture   Final    NO GROWTH 5 DAYS Performed at [facility], [address] [place], [place] [address]    Report Status 03/22/2021 FINAL  Final  Culture, blood (routine x 2)     Status: None   Collection Time: 03/17/21  5:57 PM   Specimen: BLOOD  Result Value Ref Range  Status   Specimen Description   Final    BLOOD LEFT ANTECUBITAL Performed at [facility], [address] [place], [place] [address]    Special Requests   Final    BOTTLES DRAWN AEROBIC AND ANAEROBIC Blood Culture adequate volume Performed at [facility], [address] [place], [place] [address]    Culture   Final    NO GROWTH 5 DAYS Performed at [facility], [address], [place], [place] [address]    Report Status 03/22/2021 FINAL  Final  Acid Fast Smear (AFB)     Status: None   Collection Time: 03/17/21  9:45 PM   Specimen: Sputum  Result Value Ref Range Status  AFB Specimen Processing Concentration  Final   Acid Fast Smear Negative  Final    Comment: (NOTE) Performed At: [facility] [address] [place], [place] [id number] Jolene SchimkeNagendra Sanjai MD NW:[id number]:[phone]    Source (AFB) EXPECTORATED SPUTUM  Final    Comment: Performed at [facility], [address] [place], [place] [address]  Urine culture     Status: None   Collection Time: 03/17/21 10:13 PM   Specimen: In/Out Cath Urine  Result Value Ref Range Status   Specimen Description   Final    IN/OUT CATH URINE Performed at [facility], [address] [place], [place] [address]    Special Requests   Final    NONE Performed at [facility], [address] [place], [place] [address]    Culture   Final    NO GROWTH Performed at [facility], [address] [place], [place] [address]    Report Status 03/19/2021 FINAL  Final  Culture, sputum-assessment     Status: None   Collection Time: 03/18/21 12:03 AM   Specimen: Sputum  Result Value Ref Range Status   Specimen Description SPU  Final   Special Requests NONE  Final   Sputum evaluation   Final    THIS SPECIMEN IS ACCEPTABLE FOR SPUTUM CULTURE Performed at [facility], [address] [place], [place] [address]    Report  Status 03/18/2021 FINAL  Final  Culture, Respiratory w Gram Stain     Status: None   Collection Time: 03/18/21 12:03 AM   Specimen: Sputum  Result Value Ref Range Status   Specimen Description   Final    SPU Performed at [facility], [address] [place], [place] [address]    Special Requests   Final    NONE Reflexed from [id number] Performed at [facility], [address] [place], [place] [address]    Gram Stain   Final    RARE WBC PRESENT, PREDOMINANTLY PMN RARE GRAM NEGATIVE RODS RARE YEAST RARE GRAM POSITIVE RODS    Culture   Final    RARE Normal respiratory flora-no Staph aureus or Pseudomonas seen Performed at [facility], [address] [place], [place] [address]    Report Status 03/20/2021 FINAL  Final  MTB RIF NAA w/o Culture, Sputum     Status: None   Collection Time: 03/18/21  2:28 AM  Result Value Ref Range Status   M Tuberculosis Complex Comment NOT DETECTED Final    Comment: (NOTE) Mycobacterium tuberculosis complex (MTBC) NOT detected. Per the College of American Pathologists (CAP) guidelines, a culture must be performed on all samples regardless of the molecular test result. By ordering this test code, the client has assumed responsibility for the performance of the culture.    Rifampin Comment Susceptible Final    Comment: (NOTE) Because Mycobacterium tuberculosis complex (MTBC) was not detected, no rifampin determination is possible.    AFB Specimen Processing Concentration  Final    Comment: (NOTE) Performed At: [facility] [address] [place], [place] [id number] Jolene SchimkeNagendra Sanjai MD ZY:[id number]:[phone]       Radiology Studies: No results found.  Scheduled Meds: . amoxicillin-clavulanate  1 tablet Oral Q12H  . enoxaparin (LOVENOX) injection  40 mg Subcutaneous Q24H  . feeding supplement  1 Container Oral BID BM  . feeding supplement  237 mL Oral BID BM  . guaiFENesin  1,200 mg Oral BID  .  nicotine  21 mg Transdermal Daily  . pantoprazole  40 mg Oral Daily  . vitamin B-12  1,000 mcg Oral Daily   Continuous Infusions:   LOS: 10 days   Time spent: 25 minutes.  Tyrone Nine, MD [facility] [email or web address] 03/27/2021, 11:01 AM

## 2021-03-27 NOTE — Progress Notes (Signed)
[facility]  Date of Admission:  03/17/2021           Reason for visit: Follow up on pneumonia  Current antibiotics: Augmentin 4/12   Previous antibiotics: Ceftriaxone 4/5--4/8 Azithromycin 4/5--4/9 Amp sulbactam 4/9--4/12  ASSESSMENT:    Pneumonia: CT chest with airspace consolidation of RUL and RML with air bronchogram.  Currently on day #10 therapy  and improved while awaiting TB rule out Poor dentition Homelessness Substance use: UDS positive for cocaine Elevated LFTs: HCV, HBV surface Ag negative  PLAN:    Continue Augmentin and plan for 14 days of therapy given extensive pneumonia  AFB smear negative x 1.  2nd and 3rd smear still in process.  Awaiting AFB smear x 3 to rule out.  MTB NAAT also negative x 1.  Continue airborne precautions for now although this seems likely to be a polymicrobial bacterial infection related to his poor dentition/ondontogenic source Unfortunately the delay in processing for his AFB smear is adding to his length of stay Needs dentistry follow up Okay to DC whenever 3rd smear is negative Will sign off for now.  Please call back if any concerns regarding AFB smears    Principal Problem:   Severe sepsis (HCC) Active Problems:   Community acquired pneumonia of right upper lobe of lung   Hyponatremia   Normocytic anemia   Tobacco abuse   Homelessness   Protein-calorie malnutrition, severe    MEDICATIONS:    Scheduled Meds: . amoxicillin-clavulanate  1 tablet Oral Q12H  . enoxaparin (LOVENOX) injection  40 mg Subcutaneous Q24H  . feeding supplement  1 Container Oral BID BM  . feeding supplement  237 mL Oral BID BM  . guaiFENesin  1,200 mg Oral BID  . nicotine  21 mg Transdermal Daily  . pantoprazole  40 mg Oral Daily  . vitamin B-12  1,000 mcg Oral Daily   Continuous Infusions:  PRN Meds:.acetaminophen **OR** acetaminophen, ibuprofen, iohexol, ondansetron **OR** ondansetron (ZOFRAN) IV,  senna-docusate, traZODone  SUBJECTIVE:   24 hour events:  No acute events  Remains on room air No fevers Reports difficulty with having a bowel movement Breathing improved every day    OBJECTIVE:   Blood pressure 114/78, pulse 79, temperature 98 F (36.7 C), temperature source Oral, resp. rate 18, height 5\' 8"  (1.727 m), weight 59.1 kg, SpO2 98 %. Body mass index is 19.81 kg/m.  Physical Exam Constitutional:      General: He is not in acute distress.    Appearance: He is well-developed.     Comments: Sitting up at edge of bed.   HENT:     Head: Normocephalic and atraumatic.  Eyes:     Extraocular Movements: Extraocular movements intact.     Conjunctiva/sclera: Conjunctivae normal.  Pulmonary:     Effort: Pulmonary effort is normal. No respiratory distress.     Comments: On room air Musculoskeletal:     Cervical back: Normal range of motion.  Skin:    General: Skin is warm and dry.     Findings: No rash.  Neurological:     General: No focal deficit present.     Mental Status: He is alert and oriented to person, place, and time.      Lab Results: Lab Results  Component Value Date   WBC 9.0 03/25/2021   HGB 8.7 (L) 03/25/2021   HCT 28.3 (L) 03/25/2021   MCV 105.2 (H) 03/25/2021   PLT 576 (H) 03/25/2021  Lab Results  Component Value Date   NA 133 (L) 03/25/2021   K 4.3 03/25/2021   CO2 24 03/25/2021   GLUCOSE 91 03/25/2021   BUN 10 03/25/2021   CREATININE 0.58 (L) 03/25/2021   CALCIUM 7.9 (L) 03/25/2021   GFRNONAA >60 03/25/2021   GFRAA >60 11/12/2019    Lab Results  Component Value Date   ALT 53 (H) 03/25/2021   AST 48 (H) 03/25/2021   ALKPHOS 64 03/25/2021   BILITOT 0.1 (L) 03/25/2021    No results found for: CRP  No results found for: ESRSEDRATE   I have reviewed the micro and lab results in Epic.  Imaging: No results found.   Imaging  independently reviewed in Epic.    Vedia Coffer for Infectious  Disease [facility] [phone] pager 03/27/2021, 9:14 AM

## 2021-03-28 DIAGNOSIS — A419 Sepsis, unspecified organism: Secondary | ICD-10-CM | POA: Diagnosis not present

## 2021-03-28 DIAGNOSIS — R6520 Severe sepsis without septic shock: Secondary | ICD-10-CM | POA: Diagnosis not present

## 2021-03-28 LAB — ACID FAST SMEAR (AFB, MYCOBACTERIA): Acid Fast Smear: NEGATIVE

## 2021-03-28 NOTE — Progress Notes (Signed)
PROGRESS NOTE  Henry Harris  GLO:[id number] DOB: August 25, 1961 DOA: 03/17/2021 PCP: Patient, No Pcp Per (Inactive)   Brief Narrative: Henry Harris is a 60 y.o. male with a history of HTN, tobacco use who presented from jail for shortness of breath and productive cough found to have RUL/RML pneumonia, admitted for IV antibiotics. ID was consulted, suspected polymicrobial infection related to poor dentition, guiding 14 days antibiotics. TB rule out is underway and will need to be completed (AFB smears x3) prior to discharge given high risk factors.  Assessment & Plan: Principal Problem:   Severe sepsis (HCC) Active Problems:   Community acquired pneumonia of right upper lobe of lung   Hyponatremia   Normocytic anemia   Tobacco abuse   Homelessness   Protein-calorie malnutrition, severe  Severe sepsis due to RUL, RML pneumonia: Covid, flu negative.  - Continue airborne precautions and monitoring serial AFB due to high risk for TB. AFB negative 4/5, pending (on last check 4/16) from 4/10, repeating 4/13. MTB NAAT negative, quantiferon negative. Ideally would have 3 negative tests prior to dropping airborne isolation. Pt returning to jail at discharge and is at high risk of MTB.  - Switched CTX, azithromycin to augmentin, will continue for total of 14 days per ID.   Poor dentition: Without drainable abscess on maxillofacial CT. - Requires outpatient dentistry follow up.   Macrocytic anemia due to vitamin B12 deficiency:  - Supplementing B12.  Hypokalemia:  - Resolved.   AOCD: Hgb stable.  - Monitor intermittently.   Hyponatremia: Improve  HTN:  - Normotensive without medications at this time.   Tobacco use:  - Cessation counseling provided - Nicotine patch  Cocaine use: +UDS despite incarcerated status. - Cessation counseling provided.   LFT elevation: Improving. HCV, HBV surface Ag negative.   Severe protein-calorie malnutrition:  - Dietitian consulted, supplement protein as  able.   Constipation: Worsened by sedentary status.  - Continue prns as ordered. Abd benign.  DVT prophylaxis: Lovenox Code Status: Full Family Communication: None at bedside Disposition Plan:  Status is: Inpatient  Remains inpatient appropriate because:Ongoing diagnostic testing needed not appropriate for outpatient work up   Dispo: The patient is from: [place]              Anticipated d/c is to: [facility]              Patient currently is not medically stable to d/c.   Difficult to place patient No  Consultants:   ID  Procedures:   None  Antimicrobials:  Ceftriaxone, azithromycin 4/5 - 4/9  Unasyn 4/9 - 4/15  Augmentin 4/12 >>  Subjective: No new complaints. Still with pleuritic-type right chest pain with cough which is becoming less productive. No exertional chest pain. No fever, night sweats reported.  Objective: BP 115/74   Pulse 94   Temp 98.3 F (36.8 C) (Oral)   Resp 18   Ht 5\' 8"  (1.727 m)   Wt 59.1 kg   SpO2 99%   BMI 19.81 kg/m   Gen: Chronically ill-appearing male in no distress Pulm: Nonlabored breathing room air. Clear. CV: Regular rate and rhythm.  Neuro: Alert and oriented. No focal neurological deficits. Psych: Judgement and insight appear fair. Cooperative.  CBC: Recent Labs  Lab 03/22/21 0343 03/23/21 0402 03/24/21 0658 03/25/21 0358  WBC 8.5 7.8 7.9 9.0  HGB 9.0* 9.3* 9.0* 8.7*  HCT 27.1* 29.4* 28.9* 28.3*  MCV 99.6 105.0* 105.1* 105.2*  PLT 557* 596* 586* 576*  Basic Metabolic Panel: Recent Labs  Lab 03/22/21 0343 03/23/21 0402 03/24/21 0658 03/25/21 0358  NA 129* 129* 131* 133*  K 4.7 4.0 4.4 4.3  CL 97* 98 101 101  CO2 24 22 23 24   GLUCOSE 91 110* 87 91  BUN 11 10 10 10   CREATININE 0.73 0.60* 0.68 0.58*  CALCIUM 7.7* 7.8* 7.8* 7.9*   Liver Function Tests: Recent Labs  Lab 03/22/21 0343 03/23/21 0402 03/24/21 0658 03/25/21 0358  AST 69* 80* 68* 48*  ALT 50* 58* 57* 53*  ALKPHOS 59 64 64 64  BILITOT 0.3 0.3  0.2* 0.1*  PROT 6.4* 6.3* 6.4* 6.1*  ALBUMIN 1.6* 1.6* 1.8* 1.8*    Scheduled Meds: . amoxicillin-clavulanate  1 tablet Oral Q12H  . enoxaparin (LOVENOX) injection  40 mg Subcutaneous Q24H  . feeding supplement  1 Container Oral BID BM  . feeding supplement  237 mL Oral BID BM  . guaiFENesin  1,200 mg Oral BID  . nicotine  21 mg Transdermal Daily  . pantoprazole  40 mg Oral Daily  . vitamin B-12  1,000 mcg Oral Daily   Continuous Infusions:   LOS: 11 days   Time spent: 15 minutes.  05/24/21, MD [facility] [email or web address] 03/28/2021, 8:31 AM

## 2021-03-28 NOTE — Plan of Care (Signed)

## 2021-03-28 NOTE — Plan of Care (Signed)
  Problem: Education: Goal: Knowledge of General Education information will improve Description: Including pain rating scale, medication(s)/side effects and non-pharmacologic comfort measures 03/28/2021 0933 by Aura Fey, RN Outcome: Progressing 03/28/2021 0924 by Aura Fey, RN Outcome: Progressing   Problem: Health Behavior/Discharge Planning: Goal: Ability to manage health-related needs will improve 03/28/2021 0933 by Aura Fey, RN Outcome: Progressing 03/28/2021 0924 by Aura Fey, RN Outcome: Progressing   Problem: Clinical Measurements: Goal: Ability to maintain clinical measurements within normal limits will improve 03/28/2021 0933 by Aura Fey, RN Outcome: Progressing 03/28/2021 0924 by Aura Fey, RN Outcome: Progressing Goal: Will remain free from infection 03/28/2021 0933 by Aura Fey, RN Outcome: Progressing 03/28/2021 0924 by Aura Fey, RN Outcome: Progressing Goal: Diagnostic test results will improve 03/28/2021 0933 by Aura Fey, RN Outcome: Progressing 03/28/2021 0924 by Aura Fey, RN Outcome: Progressing Goal: Respiratory complications will improve 03/28/2021 0933 by Aura Fey, RN Outcome: Progressing 03/28/2021 0924 by Aura Fey, RN Outcome: Progressing Goal: Cardiovascular complication will be avoided 03/28/2021 0933 by Aura Fey, RN Outcome: Progressing 03/28/2021 0924 by Aura Fey, RN Outcome: Progressing

## 2021-03-29 DIAGNOSIS — R6520 Severe sepsis without septic shock: Secondary | ICD-10-CM | POA: Diagnosis not present

## 2021-03-29 DIAGNOSIS — A419 Sepsis, unspecified organism: Secondary | ICD-10-CM | POA: Diagnosis not present

## 2021-03-29 NOTE — Progress Notes (Signed)
PROGRESS NOTE  GARRELL FLAGG  PXT:[id number] DOB: Aug 12, 1961 DOA: 03/17/2021 PCP: Patient, No Pcp Per (Inactive)   Brief Narrative: Henry Harris is a 60 y.o. male with a history of HTN, tobacco use who presented from jail for shortness of breath and productive cough found to have RUL/RML pneumonia, admitted for IV antibiotics. ID was consulted, suspected polymicrobial infection related to poor dentition, guiding 14 days antibiotics. TB rule out is underway and will need to be completed (AFB smears x3) prior to discharge given high risk factors.  Assessment & Plan: Principal Problem:   Severe sepsis (HCC) Active Problems:   Community acquired pneumonia of right upper lobe of lung   Hyponatremia   Normocytic anemia   Tobacco abuse   Homelessness   Protein-calorie malnutrition, severe  Severe sepsis due to RUL, RML pneumonia: Covid, flu negative.  - Continue airborne precautions and monitoring serial AFB due to high risk for TB. AFB negative 4/5, pending (on last check 4/17) from 4/10, negative from 4/13. MTB NAAT negative, quantiferon negative. Ideally would have 3 negative tests prior to dropping airborne isolation. Pt returning to jail at discharge and is at high risk of MTB.  - Switched CTX, azithromycin to augmentin, will continue for total of 14 days per ID.   Poor dentition: Without drainable abscess on maxillofacial CT. - Requires outpatient dentistry follow up.   Macrocytic anemia due to vitamin B12 deficiency:  - Supplementing B12.  Hypokalemia:  - Resolved.   AOCD: Hgb stable.  - Monitor intermittently.   Hyponatremia: Improved  HTN:  - Normotensive without medications at this time.   Tobacco use:  - Cessation counseling provided - Nicotine patch  Cocaine use: +UDS despite incarcerated status. - Cessation counseling provided.   LFT elevation: Improving. HCV, HBV surface Ag negative.   Severe protein-calorie malnutrition:  - Dietitian consulted, supplement protein  as able.   Constipation: Worsened by sedentary status.  - Continue prns as ordered. Abd benign.  DVT prophylaxis: Lovenox Code Status: Full Family Communication: None at bedside Disposition Plan:  Status is: Inpatient  Remains inpatient appropriate because:Ongoing diagnostic testing needed not appropriate for outpatient work up   Dispo: The patient is from: [place]              Anticipated d/c is to: [facility]              Patient currently is not medically stable to d/c.   Difficult to place patient No  Consultants:   ID  Procedures:   None  Antimicrobials:  Ceftriaxone, azithromycin 4/5 - 4/9  Unasyn 4/9 - 4/15  Augmentin 4/12 >>  Subjective: No new complaints. No dyspnea. Chest discomfort with cough is stable to slightly improving.   Objective: BP 112/66 (BP Location: Left Arm)   Pulse 100   Temp 98.2 F (36.8 C) (Oral)   Resp 16   Ht 5\' 8"  (1.727 m)   Wt 59.1 kg   SpO2 98%   BMI 19.81 kg/m   Gen: Chronically ill-appearing male in no distress Pulm: Isolated wheeze in RUL that cleared with cough, otherwise clear, nonlabored CV: RRR, no murmur or edema Neuro: A+O, no focal deficits Psych: Stable, cooperative.  CBC: Recent Labs  Lab 03/23/21 0402 03/24/21 0658 03/25/21 0358  WBC 7.8 7.9 9.0  HGB 9.3* 9.0* 8.7*  HCT 29.4* 28.9* 28.3*  MCV 105.0* 105.1* 105.2*  PLT 596* 586* 576*   Basic Metabolic Panel: Recent Labs  Lab 03/23/21 0402 03/24/21 05/24/21 03/25/21 0358  NA 129* 131* 133*  K 4.0 4.4 4.3  CL 98 101 101  CO2 22 23 24   GLUCOSE 110* 87 91  BUN 10 10 10   CREATININE 0.60* 0.68 0.58*  CALCIUM 7.8* 7.8* 7.9*   Liver Function Tests: Recent Labs  Lab 03/23/21 0402 03/24/21 0658 03/25/21 0358  AST 80* 68* 48*  ALT 58* 57* 53*  ALKPHOS 64 64 64  BILITOT 0.3 0.2* 0.1*  PROT 6.3* 6.4* 6.1*  ALBUMIN 1.6* 1.8* 1.8*    Scheduled Meds: . amoxicillin-clavulanate  1 tablet Oral Q12H  . enoxaparin (LOVENOX) injection  40 mg Subcutaneous Q24H   . feeding supplement  1 Container Oral BID BM  . feeding supplement  237 mL Oral BID BM  . guaiFENesin  1,200 mg Oral BID  . nicotine  21 mg Transdermal Daily  . pantoprazole  40 mg Oral Daily  . vitamin B-12  1,000 mcg Oral Daily   Continuous Infusions:   LOS: 12 days   Time spent: 15 minutes.  05/24/21, MD [facility] [email or web address] 03/29/2021, 12:00 PM

## 2021-03-30 LAB — ACID FAST SMEAR (AFB, MYCOBACTERIA): Acid Fast Smear: NEGATIVE

## 2021-03-30 MED ORDER — BOOST / RESOURCE BREEZE PO LIQD CUSTOM
1.0000 | ORAL | Status: DC
  Administered 2021-03-30: 1 via ORAL

## 2021-03-30 NOTE — Progress Notes (Signed)
PROGRESS NOTE  Henry Harris  UMP:[id number] DOB: 1961/07/28 DOA: 03/17/2021 PCP: Patient, No Pcp Per (Inactive)   Brief Narrative: CHINEDUM VANHOUTEN is a 60 y.o. male with a history of HTN, tobacco use who presented from jail for shortness of breath and productive cough found to have RUL/RML pneumonia, admitted for IV antibiotics. ID was consulted, suspected polymicrobial infection related to poor dentition, guiding 14 days antibiotics. TB rule out is underway and will need to be completed (AFB smears x3) prior to discharge given high risk factors.  Assessment & Plan: Principal Problem:   Severe sepsis (HCC) Active Problems:   Community acquired pneumonia of right upper lobe of lung   Hyponatremia   Normocytic anemia   Tobacco abuse   Homelessness   Protein-calorie malnutrition, severe  Severe sepsis due to RUL, RML pneumonia: Covid, flu negative.  - Continue airborne precautions and monitoring serial AFB due to high risk for TB. AFB negative 4/5, pending (on last check 4/18) from 4/10, negative from 4/13. MTB NAAT negative, quantiferon negative. Ideally would have 3 negative tests prior to dropping airborne isolation. Pt returning to jail at discharge and is at high risk of MTB. AFB smear, per lab, will not be completed until 4/20. I discussed this with ID who continues to recommend the safest strategy which is to remain on isolation/out of general prison population until 3rd AFB smear is negative. - Switched CTX, azithromycin to augmentin, will continue for total of 14 days per ID.   Poor dentition: Without drainable abscess on maxillofacial CT. - Requires outpatient dentistry follow up.   Macrocytic anemia due to vitamin B12 deficiency:  - Supplementing B12.  Hypokalemia:  - Resolved.   AOCD: Hgb stable.  - Monitor intermittently.   Hyponatremia: Improved  HTN:  - Normotensive without medications at this time.   Tobacco use:  - Cessation counseling provided - Nicotine  patch  Cocaine use: +UDS despite incarcerated status. - Cessation counseling provided.   LFT elevation: Improving. HCV, HBV surface Ag negative.   Severe protein-calorie malnutrition:  - Dietitian consulted, supplement protein as able.   Constipation: Worsened by sedentary status.  - Continue prns as ordered. Abd benign.  DVT prophylaxis: Lovenox Code Status: Full Family Communication: None at bedside Disposition Plan:  Status is: Inpatient  Remains inpatient appropriate because:Ongoing diagnostic testing needed not appropriate for outpatient work up AFB smear from 4/10 expected back 4/20 at which time, if negative, patient may be discharged to return to incarceration.   Dispo: The patient is from: [place]              Anticipated d/c is to: [facility]              Patient currently is not medically stable to d/c.   Difficult to place patient No  Consultants:   ID  Procedures:   None  Antimicrobials:  Ceftriaxone, azithromycin 4/5 - 4/9  Unasyn 4/9 - 4/15  Augmentin 4/12 >>  Subjective: No new complaints. Cough stable. No fever or dyspnea.  Objective: BP 123/71   Pulse 90   Temp 98.3 F (36.8 C) (Oral)   Resp 19   Ht 5\' 8"  (1.727 m)   Wt 59.1 kg   SpO2 98%   BMI 19.81 kg/m   Gen: No distress Pulm: Clear and nonlabored  CBC: Recent Labs  Lab 03/24/21 0658 03/25/21 0358  WBC 7.9 9.0  HGB 9.0* 8.7*  HCT 28.9* 28.3*  MCV 105.1* 105.2*  PLT 586* 576*  Basic Metabolic Panel: Recent Labs  Lab 03/24/21 0658 03/25/21 0358  NA 131* 133*  K 4.4 4.3  CL 101 101  CO2 23 24  GLUCOSE 87 91  BUN 10 10  CREATININE 0.68 0.58*  CALCIUM 7.8* 7.9*   Liver Function Tests: Recent Labs  Lab 03/24/21 0658 03/25/21 0358  AST 68* 48*  ALT 57* 53*  ALKPHOS 64 64  BILITOT 0.2* 0.1*  PROT 6.4* 6.1*  ALBUMIN 1.8* 1.8*    Scheduled Meds: . amoxicillin-clavulanate  1 tablet Oral Q12H  . enoxaparin (LOVENOX) injection  40 mg Subcutaneous Q24H  . feeding  supplement  1 Container Oral BID BM  . feeding supplement  237 mL Oral BID BM  . guaiFENesin  1,200 mg Oral BID  . nicotine  21 mg Transdermal Daily  . pantoprazole  40 mg Oral Daily  . vitamin B-12  1,000 mcg Oral Daily   Continuous Infusions:   LOS: 13 days   Time spent: 15 minutes.  Tyrone Nine, MD [facility] [email or web address] 03/30/2021, 12:11 PM

## 2021-03-30 NOTE — Progress Notes (Signed)
Nutrition Follow-up  DOCUMENTATION CODES:   Severe malnutrition in context of acute illness/injury,Severe malnutrition in context of social or environmental circumstances  INTERVENTION:  - will decrease Boost Breeze from BID to once/day. - continue Ensure Enlive BID.    NUTRITION DIAGNOSIS:   Severe Malnutrition related to social / environmental circumstances,acute illness as evidenced by severe fat depletion,severe muscle depletion. -ongoing  GOAL:   Patient will meet greater than or equal to 90% of their needs -met  MONITOR:   PO intake,Supplement acceptance,Labs,Weight trends  ASSESSMENT:   Pt with PMH of HTN and tobacco abuse from jail admitted with severe sepsis due to RUL PNA.  Patient continues to eat 100% at meals. He has been accepting Boost Breeze 50% of the time offered and Ensure 100% of the time offered.   He was last weighed on 4/13 at which time he weighed 130 lb; the only other weight this admission was from date of admission (4/5) when weight, which appears to be a stated weight, was 165 lb. No information documented in the edema section of flow sheet.  Patient still undergoing TB rule out d/t needing AFB smear x3. Plan for d/c back to jail when he is stable and safe for d/c.    Labs reviewed; Na: 133 mmol/l, creatinine: 0.58 mg/dl, Ca: 7.9 mg/dl. Medications reviewed; 40 mg oral protonix/day, 1000 mcg oral cyanocobalamin/day.    Diet Order:   Diet Order            Diet regular Room service appropriate? Yes; Fluid consistency: Thin  Diet effective now                 EDUCATION NEEDS:   Not appropriate for education at this time  Skin:  Skin Assessment: Reviewed RN Assessment  Last BM:  4/16  Height:   Ht Readings from Last 1 Encounters:  03/17/21 _0  (1.727 m)    Weight:   Wt Readings from Last 1 Encounters:  03/25/21 59.1 kg    Estimated Nutritional Needs:  Kcal:  1900-2100 Protein:  95-110 grams Fluid:  >1.9  L/day     Jarome Matin, MS, RD, LDN, CNSC Inpatient Clinical Dietitian RD pager # available in [facility]  After hours/weekend pager # available in [facility]

## 2021-03-30 NOTE — Progress Notes (Signed)
Pt's barrier to discharge is a 3rd negative smear. The sample collected on the 4/13 has resulted but the labs from 4/10 has not. Lab tech from [facility] stated that it should result on 4/20. MD made aware. Val Eagle

## 2021-03-30 NOTE — Plan of Care (Signed)

## 2021-03-31 DIAGNOSIS — E43 Unspecified severe protein-calorie malnutrition: Secondary | ICD-10-CM

## 2021-03-31 LAB — RESP PANEL BY RT-PCR (FLU A&B, COVID) ARPGX2
Influenza A by PCR: NEGATIVE
Influenza B by PCR: NEGATIVE
SARS Coronavirus 2 by RT PCR: NEGATIVE

## 2021-03-31 MED ORDER — CYANOCOBALAMIN 1000 MCG PO TABS: 1000.0000 ug | ORAL_TABLET | Freq: Every day | ORAL | 0 refills | Status: AC

## 2021-03-31 NOTE — TOC Progression Note (Signed)
Transition of Care [facility]) - Progression Note    Patient Details  Name: Henry Harris MRN: [id number] Date of Birth: 1961/09/30  Transition of Care [facility]) CM/SW Contact  Geni Bers, RN Phone Number: 03/31/2021, 3:24 PM  Clinical Narrative:     Pt is AFB and COVID negative. Pt will return [facility].   [facility] Barriers to Discharge: No Barriers Identified  Expected Discharge Plan and Services Expected Discharge Plan: [facility]         Expected Discharge Date: 03/31/21                                     Social Determinants of Health (SDOH) Interventions    Readmission Risk Interventions No flowsheet data found.

## 2021-03-31 NOTE — Discharge Summary (Addendum)
Physician Discharge Summary  Henry Harris ASN:[id number] DOB: 04-11-1961 DOA: 03/17/2021  PCP: Patient, No Pcp Per (Inactive)  Admit date: 03/17/2021 Discharge date: 03/31/2021  Admitted From: Jail Disposition: Jail   Recommendations for Outpatient Follow-up:  1. Follow up with PCP in 1-2 weeks, suggest recheck CBC, CMP at follow up. 2. Recommend follow up with dentist. 3. Continue vitamin B12 supplementation and protein supplementation as much as possible.  Home Health: None Equipment/Devices: None Discharge Condition: Stable CODE STATUS: Full Diet recommendation: As tolerated  Brief/Interim Summary: Henry Harris is a 60 y.o. male with a history of HTN, tobacco use who presented from jail for shortness of breath and productive cough found to have RUL/RML pneumonia, admitted for IV antibiotics. ID was consulted, suspected polymicrobial infection related to poor dentition, guiding 14 days antibiotics which have been completed prior to discharge. TB was ruled out as detailed below prior to discharge.   Discharge Diagnoses:  Principal Problem:   Severe sepsis (HCC) Active Problems:   Community acquired pneumonia of right upper lobe of lung   Hyponatremia   Normocytic anemia   Tobacco abuse   Homelessness   Protein-calorie malnutrition, severe  Severe sepsis due to RUL, RML pneumonia: Covid, flu negative. AFB smears negative 4/5, 4/10, and 4/13. MTB NAAT negative, quantiferon negative. - Has completed total of 14 days per ID.   Poor dentition: Without drainable abscess on maxillofacial CT. - Requires outpatient dentistry follow up.   Macrocytic anemia due to vitamin B12 deficiency:  - Supplementing B12.  Hypokalemia:  - Resolved.   AOCD: Hgb stable.  - Monitor intermittently.   Hyponatremia: Improved  HTN:  - Normotensive without medications at this time.   Tobacco use:  - Cessation counseling provided - Consider nicotine patch  Cocaine use: +UDS despite  incarcerated status. - Cessation counseling provided.   LFT elevation: Improving. HCV, HBV surface Ag negative.   Severe protein-calorie malnutrition:  - Dietitian consulted, supplement protein as able.   Constipation: Worsened by sedentary status.  - Continue prns   Discharge Instructions Discharge Instructions    Discharge instructions   Complete by: As directed    You were treated for pneumonia which may have been due to poor dental health. You have completed 14 days of antibiotics with sustained improvement and are stable for discharge. Tuberculosis has been ruled out. Your vitamin B12 level was found to be low, so you should start taking that supplement daily, and follow up with a dentist.     Allergies as of 03/31/2021   No Known Allergies     Medication List    TAKE these medications   cyanocobalamin 1000 MCG tablet Take 1 tablet (1,000 mcg total) by mouth daily.   GOODY HEADACHE PO Take 1-2 packets by mouth daily as needed (for pain).       No Known Allergies  Consultations:  ID  Procedures/Studies: DG Chest 2 View  Result Date: 03/17/2021 CLINICAL DATA:  60 year old male with shortness of breath and cough. EXAM: CHEST - 2 VIEW COMPARISON:  Chest radiograph dated 02/05/2016. FINDINGS: Diffuse interstitial and airspace opacity primarily involving the right upper lobe most consistent with pneumonia. Clinical correlation and follow-up after treatment to resolution recommended to exclude a neoplastic process. The left lung is clear. No pleural effusion pneumothorax. The cardiac silhouette is within limits. No acute osseous pathology. IMPRESSION: Right upper lobe pneumonia. Electronically Signed   By: Elgie Collard M.D.   On: 03/17/2021 17:48   CT CHEST WO CONTRAST  Result Date: 03/17/2021 CLINICAL DATA:  Shortness of breath and fever. COVID negative. Pneumonia on chest radiograph. EXAM: CT CHEST WITHOUT CONTRAST TECHNIQUE: Multidetector CT imaging of the chest  was performed following the standard protocol without IV contrast. COMPARISON:  Chest radiograph 03/17/2021 FINDINGS: Cardiovascular: Normal heart size. No pericardial effusions. Coronary artery and aortic calcification. Normal caliber of the thoracic aorta. Mediastinum/Nodes: Prominent mediastinal lymph nodes with pretracheal nodes measuring up to about 11 mm short axis dimension. Nonspecific etiology. Esophagus is decompressed. Lungs/Pleura: Airspace consolidation involving the right upper lung and right middle lung with air bronchograms and without significant volume loss. Changes most likely represent consolidative pneumonia. Follow-up after resolution of the acute process is recommended to exclude a centrally obstructing lesion. The right lower lung and the left lung are clear. Mild emphysematous changes suggested in the left apex. No pleural effusion or pneumothorax. Upper Abdomen: No acute abnormalities demonstrated in the visualized upper abdomen. Musculoskeletal: Degenerative changes in the spine. IMPRESSION: Airspace consolidation involving the right upper lung and right middle lung with air bronchograms and without significant volume loss. Changes most likely represent consolidative pneumonia. Follow-up after resolution of the acute process is recommended to exclude a centrally obstructing lesion. Aortic Atherosclerosis (ICD10-I70.0). Electronically Signed   By: Burman Nieves M.D.   On: 03/17/2021 22:23   CT MAXILLOFACIAL W CONTRAST  Result Date: 03/21/2021 CLINICAL DATA:  Odontogenic abscess EXAM: CT MAXILLOFACIAL WITH CONTRAST TECHNIQUE: Multidetector CT imaging of the maxillofacial structures was performed with intravenous contrast. Multiplanar CT image reconstructions were also generated. CONTRAST:  56mL OMNIPAQUE IOHEXOL 300 MG/ML  SOLN COMPARISON:  None. FINDINGS: Osseous: Diffuse dental disease with multiple dental caries and periapical lucencies there is no abscess or fluid collection. Mild  soft tissue swelling adjacent to teeth 17 and 18. Orbits: Negative. No traumatic or inflammatory finding. Sinuses: Clear. Soft tissues: Negative. Limited intracranial: No significant or unexpected finding. IMPRESSION: Diffuse dental disease with multiple dental caries and periapical lucencies. No abscess or fluid collection. Electronically Signed   By: Deatra Robinson M.D.   On: 03/21/2021 19:10     Subjective: No shortness of breath reported. Concerned about weight loss, but feels this may be starting to improve.   Discharge Exam: Vitals:   03/30/21 2151 03/31/21 0611  BP: 121/74 128/75  Pulse: 86 90  Resp: 20 18  Temp: 97.9 F (36.6 C) 97.7 F (36.5 C)  SpO2: 99% 97%   General: Chronically ill appearing but in no distress Cardiovascular: RRR  Respiratory: Nonlabored and clear  Labs: BNP (last 3 results) No results for input(s): BNP in the last 8760 hours. Basic Metabolic Panel: Recent Labs  Lab 03/25/21 0358  NA 133*  K 4.3  CL 101  CO2 24  GLUCOSE 91  BUN 10  CREATININE 0.58*  CALCIUM 7.9*   Liver Function Tests: Recent Labs  Lab 03/25/21 0358  AST 48*  ALT 53*  ALKPHOS 64  BILITOT 0.1*  PROT 6.1*  ALBUMIN 1.8*   No results for input(s): LIPASE, AMYLASE in the last 168 hours. No results for input(s): AMMONIA in the last 168 hours. CBC: Recent Labs  Lab 03/25/21 0358  WBC 9.0  HGB 8.7*  HCT 28.3*  MCV 105.2*  PLT 576*   Cardiac Enzymes: No results for input(s): CKTOTAL, CKMB, CKMBINDEX, TROPONINI in the last 168 hours. BNP: Invalid input(s): POCBNP CBG: No results for input(s): GLUCAP in the last 168 hours. D-Dimer No results for input(s): DDIMER in the last 72 hours. Hgb A1c No  results for input(s): HGBA1C in the last 72 hours. Lipid Profile No results for input(s): CHOL, HDL, LDLCALC, TRIG, CHOLHDL, LDLDIRECT in the last 72 hours. Thyroid function studies No results for input(s): TSH, T4TOTAL, T3FREE, THYROIDAB in the last 72  hours.  Invalid input(s): FREET3 Anemia work up No results for input(s): VITAMINB12, FOLATE, FERRITIN, TIBC, IRON, RETICCTPCT in the last 72 hours. Urinalysis    Component Value Date/Time   COLORURINE AMBER (A) 03/17/2021 2213   APPEARANCEUR CLEAR 03/17/2021 2213   LABSPEC 1.015 03/17/2021 2213   PHURINE 6.0 03/17/2021 2213   GLUCOSEU NEGATIVE 03/17/2021 2213   HGBUR SMALL (A) 03/17/2021 2213   BILIRUBINUR SMALL (A) 03/17/2021 2213   KETONESUR NEGATIVE 03/17/2021 2213   PROTEINUR NEGATIVE 03/17/2021 2213   NITRITE NEGATIVE 03/17/2021 2213   LEUKOCYTESUR NEGATIVE 03/17/2021 2213    Microbiology Recent Results (from the past 240 hour(s))  Acid Fast Smear (AFB)     Status: None   Collection Time: 03/22/21  6:50 AM   Specimen: Sputum  Result Value Ref Range Status   AFB Specimen Processing Concentration  Final   Acid Fast Smear Negative  Final    Comment: (NOTE) Performed At: [facility] [address] [place], [place] [id number] Jolene Schimke MD CX:[id number]    Source (AFB) SPUTUM  Final    Comment: Performed at [facility], [address] [place], [place] [address]  Acid Fast Smear (AFB)     Status: None   Collection Time: 03/25/21  8:11 AM   Specimen: Sputum  Result Value Ref Range Status   AFB Specimen Processing Concentration  Final   Acid Fast Smear Negative  Final    Comment: (NOTE) Performed At: [facility] [address] [place], [place] [id number] Jolene Schimke MD DX:[id number]    Source (AFB) EXPECTORATED SPUTUM  Final    Comment: Performed at [facility] [place], [address] [place], [place] [address]    Time coordinating discharge: Approximately 40 minutes  Tyrone Nine, MD  [facility] 03/31/2021, 7:29 AM

## 2021-03-31 NOTE — Progress Notes (Signed)
Made aware of discharge, per captain, next available transport for patient would be available at around 1700.Clydie Braun also endorsed this way, the patient would be able to have lunch and dinner, with no issues with missing lunch, dinner at prison. Clydie Braun also endorsed every transport for patient was  currently busy. This nurse endorsed understanding.

## 2021-03-31 NOTE — Progress Notes (Signed)
Discharge instructions explained to patient, and captain, explained discharge paperwork, negative tb results and negative COVID-test would be placed in a envelope to give to nurse. Clydie Braun called for transport around 16:30 for patient to be transported back to facility. Still awaiting transport.

## 2021-03-31 NOTE — Progress Notes (Addendum)
Patient left via wheelchair, with captain and additional policeman to [facility]. Patient was alert and oriented upon leaving. No pain needs identified. Clydie Braun had discharge paperwork in hand. Verified with cookie case manager prior to patient leaving that no report needed to be called.

## 2021-05-04 LAB — ACID FAST CULTURE WITH REFLEXED SENSITIVITIES (MYCOBACTERIA): Acid Fast Culture: NEGATIVE

## 2021-05-12 LAB — ACID FAST CULTURE WITH REFLEXED SENSITIVITIES (MYCOBACTERIA): Acid Fast Culture: NEGATIVE

## 2021-05-26 LAB — ACID FAST CULTURE WITH REFLEXED SENSITIVITIES (MYCOBACTERIA): Acid Fast Culture: NEGATIVE

## 2021-09-25 ENCOUNTER — Other Ambulatory Visit: Payer: Self-pay

## 2021-09-25 ENCOUNTER — Emergency Department (HOSPITAL_COMMUNITY): Payer: Medicaid Other

## 2021-09-25 ENCOUNTER — Emergency Department (HOSPITAL_COMMUNITY)
Admission: EM | Admit: 2021-09-25 | Discharge: 2021-09-25 | Disposition: A | Discharge status: Home / Self Care | Payer: Medicaid Other | Source: Home / Self Care | Attending: Emergency Medicine | Admitting: Emergency Medicine

## 2021-09-25 ENCOUNTER — Emergency Department (HOSPITAL_BASED_OUTPATIENT_CLINIC_OR_DEPARTMENT_OTHER): Payer: Medicaid Other | Admitting: Radiology

## 2021-09-25 ENCOUNTER — Emergency Department (HOSPITAL_BASED_OUTPATIENT_CLINIC_OR_DEPARTMENT_OTHER)
Admission: EM | Admit: 2021-09-25 | Discharge: 2021-09-25 | Disposition: A | Discharge status: Home / Self Care | Payer: Medicaid Other | Attending: Emergency Medicine | Admitting: Emergency Medicine

## 2021-09-25 ENCOUNTER — Encounter (HOSPITAL_BASED_OUTPATIENT_CLINIC_OR_DEPARTMENT_OTHER): Payer: Self-pay

## 2021-09-25 ENCOUNTER — Encounter (HOSPITAL_COMMUNITY): Payer: Self-pay

## 2021-09-25 DIAGNOSIS — F10220 Alcohol dependence with intoxication, uncomplicated: Secondary | ICD-10-CM | POA: Diagnosis not present

## 2021-09-25 DIAGNOSIS — R062 Wheezing: Secondary | ICD-10-CM | POA: Insufficient documentation

## 2021-09-25 DIAGNOSIS — F17210 Nicotine dependence, cigarettes, uncomplicated: Secondary | ICD-10-CM | POA: Insufficient documentation

## 2021-09-25 DIAGNOSIS — Z79899 Other long term (current) drug therapy: Secondary | ICD-10-CM | POA: Insufficient documentation

## 2021-09-25 DIAGNOSIS — R0789 Other chest pain: Secondary | ICD-10-CM | POA: Insufficient documentation

## 2021-09-25 DIAGNOSIS — I10 Essential (primary) hypertension: Secondary | ICD-10-CM | POA: Insufficient documentation

## 2021-09-25 DIAGNOSIS — R079 Chest pain, unspecified: Secondary | ICD-10-CM | POA: Insufficient documentation

## 2021-09-25 DIAGNOSIS — Y906 Blood alcohol level of 120-199 mg/100 ml: Secondary | ICD-10-CM | POA: Diagnosis not present

## 2021-09-25 DIAGNOSIS — R0602 Shortness of breath: Secondary | ICD-10-CM | POA: Insufficient documentation

## 2021-09-25 DIAGNOSIS — Z5321 Procedure and treatment not carried out due to patient leaving prior to being seen by health care provider: Secondary | ICD-10-CM | POA: Insufficient documentation

## 2021-09-25 LAB — BASIC METABOLIC PANEL
Anion gap: 8 (ref 5–15)
Anion gap: 8 (ref 5–15)
BUN: 9 mg/dL (ref 6–20)
BUN: 8 mg/dL (ref 6–20)
CO2: 26 mmol/L (ref 22–32)
CO2: 28 mmol/L (ref 22–32)
Calcium: 8.8 mg/dL — ABNORMAL LOW (ref 8.9–10.3)
Calcium: 8.6 mg/dL — ABNORMAL LOW (ref 8.9–10.3)
Chloride: 105 mmol/L (ref 98–111)
Chloride: 104 mmol/L (ref 98–111)
Creatinine, Ser: 0.84 mg/dL (ref 0.61–1.24)
Creatinine, Ser: 1 mg/dL (ref 0.61–1.24)
GFR, Estimated: >60 mL/min (ref >60)
GFR, Estimated: >60 mL/min (ref >60)
Glucose, Bld: 87 mg/dL (ref 70–99)
Glucose, Bld: 98 mg/dL (ref 70–99)
Potassium: 4.5 mmol/L (ref 3.5–5.1)
Potassium: 3.8 mmol/L (ref 3.5–5.1)
Sodium: 139 mmol/L (ref 135–145)
Sodium: 140 mmol/L (ref 135–145)

## 2021-09-25 LAB — CBC
HCT: 48.5 % (ref 39.0–52.0)
Hemoglobin: 15.7 g/dL (ref 13.0–17.0)
MCH: 33.3 pg (ref 26.0–34.0)
MCHC: 32.4 g/dL (ref 30.0–36.0)
MCV: 103 fL — ABNORMAL HIGH (ref 80.0–100.0)
Platelets: 289 10*3/uL (ref 150–400)
RBC: 4.71 MIL/uL (ref 4.22–5.81)
RDW: 14.5 % (ref 11.5–15.5)
WBC: 11.6 10*3/uL — ABNORMAL HIGH (ref 4.0–10.5)
nRBC: 0 % (ref 0.0–0.2)

## 2021-09-25 LAB — CBC WITH DIFFERENTIAL/PLATELET
Abs Immature Granulocytes: 0.07 10*3/uL (ref 0.00–0.07)
Basophils Absolute: 0.1 10*3/uL (ref 0.0–0.1)
Basophils Relative: 1 %
Eosinophils Absolute: 0.3 10*3/uL (ref 0.0–0.5)
Eosinophils Relative: 3 %
HCT: 47 % (ref 39.0–52.0)
Hemoglobin: 15.3 g/dL (ref 13.0–17.0)
Immature Granulocytes: 1 %
Lymphocytes Relative: 36 %
Lymphs Abs: 3.3 10*3/uL (ref 0.7–4.0)
MCH: 33.7 pg (ref 26.0–34.0)
MCHC: 32.6 g/dL (ref 30.0–36.0)
MCV: 103.5 fL — ABNORMAL HIGH (ref 80.0–100.0)
Monocytes Absolute: 0.5 10*3/uL (ref 0.1–1.0)
Monocytes Relative: 5 %
Neutro Abs: 5.1 10*3/uL (ref 1.7–7.7)
Neutrophils Relative %: 54 %
Platelets: 300 10*3/uL (ref 150–400)
RBC: 4.54 MIL/uL (ref 4.22–5.81)
RDW: 14.5 % (ref 11.5–15.5)
WBC: 9.3 10*3/uL (ref 4.0–10.5)
nRBC: 0 % (ref 0.0–0.2)

## 2021-09-25 LAB — RAPID URINE DRUG SCREEN, HOSP PERFORMED
Amphetamines: NOT DETECTED
Barbiturates: NOT DETECTED
Benzodiazepines: NOT DETECTED
Cocaine: POSITIVE — AB
Opiates: NOT DETECTED
Tetrahydrocannabinol: NOT DETECTED

## 2021-09-25 LAB — ACETAMINOPHEN LEVEL: Acetaminophen (Tylenol), Serum: <10 ug/mL — ABNORMAL LOW (ref 10–30)

## 2021-09-25 LAB — TROPONIN I (HIGH SENSITIVITY)
Troponin I (High Sensitivity): 6 ng/L (ref <18)
Troponin I (High Sensitivity): 8 ng/L (ref <18)
Troponin I (High Sensitivity): 8 ng/L (ref <18)

## 2021-09-25 LAB — ETHANOL: Alcohol, Ethyl (B): 159 mg/dL — ABNORMAL HIGH (ref <10)

## 2021-09-25 MED ORDER — PROMETHAZINE-DM 6.25-15 MG/5ML PO SYRP: 5.0000 mL | ORAL_SOLUTION | Freq: Four times a day (QID) | ORAL | 0 refills | Status: AC | PRN

## 2021-09-25 MED ORDER — BENZONATATE 100 MG PO CAPS: 100.0000 mg | ORAL_CAPSULE | Freq: Three times a day (TID) | ORAL | 0 refills | Status: AC

## 2021-09-25 MED ORDER — AEROCHAMBER PLUS FLO-VU LARGE MISC
1.0000 | Freq: Once | Status: AC
  Administered 2021-09-25: 1

## 2021-09-25 MED ORDER — ALBUTEROL SULFATE HFA 108 (90 BASE) MCG/ACT IN AERS
2.0000 | INHALATION_SPRAY | Freq: Once | RESPIRATORY_TRACT | Status: AC
  Administered 2021-09-25: 2 via RESPIRATORY_TRACT
  Filled 2021-09-25: qty 6.7

## 2021-09-25 MED ORDER — FLUTICASONE PROPIONATE 50 MCG/ACT NA SUSP: 2.0000 | Freq: Every day | NASAL | 0 refills | Status: AC

## 2021-09-25 NOTE — ED Notes (Signed)
Family attempted to be contacted at this Time with no Response. Prior to leaving the Department, the Patient stated he was going to call his Son to provide transportation. No Contact Information found regarding this Patients Son.

## 2021-09-25 NOTE — Discharge Instructions (Addendum)
Please follow-up with your primary care doctor in the next 5 to 6 days.  You may always return to the ER for any new or concerning symptoms.  Your chest x-ray was without any evidence of pneumonia and your EKG is unchanged from prior EKGs.  Your labs were without any significant abnormalities  Please stop smoking.  I have prescribed you to cough medications and Flonase which I would like you to start using.  MAOIs return to the ER for any new or concerning symptoms.

## 2021-09-25 NOTE — ED Triage Notes (Signed)
Patient BIB [facility] from Home for SOB.  [facility] states they responded to scene Outside of Apartments. Patient states he was in Prison in March and contracted PNA due to the Prison Conditions. CP has been present for 2-3 Years and would like to be evaluated due to Increase in Frequency of Episodes.  NAD Noted during Triage. A&Ox4. GCS 15. ETOH on Board. VSS en Route.
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
## 2021-09-25 NOTE — ED Triage Notes (Signed)
Pt here for right sided chest pain. Pt states CP feels like a weird pressure he can't explain. Pt states he's been having SOB that's intermittent. Pt has hx of pneumonia and states there is a "spot on his lungs". Pt endorses a cough, but has a hx of smoking.

## 2021-09-25 NOTE — ED Provider Notes (Signed)
Emergency Medicine Provider Triage Evaluation Note  Henry Harris , a 60 y.o. male  was evaluated in triage.  Pt complains of chest pain.  Admitted with severe PNA in April, states on and off trouble for the past few months.  Ongoing cough, better than before.  Chest pain on right side, sharp.  Denies hemoptysis.  No fever.  He is still smoking fairly heavy (not as much as prior).  Review of Systems  Positive: Chest pain, cough Negative: fever  Physical Exam  BP 102/73 (BP Location: Right Arm)   Pulse 91   Temp 98.1 F (36.7 C) (Oral)   Resp 17   Ht 5\' 9"  (1.753 m)   Wt 65.8 kg   SpO2 100%   BMI 21.41 kg/m   Gen:   Awake, no distress   Resp:  Normal effort  MSK:   Moves extremities without difficulty  Other:    Medical Decision Making  Medically screening exam initiated at 3:13 AM.  Appropriate orders placed.  Henry Harris was informed that the remainder of the evaluation will be completed by another provider, this initial triage assessment does not replace that evaluation, and the importance of remaining in the ED until their evaluation is complete.  Chest pain.  Intermittent issues over the past few months following admission for PNA in April 2022.  EKG, labs, CXR.   May 2022, PA-C 09/25/21 09/27/21    2500, MD 09/25/21 [phone]

## 2021-09-25 NOTE — ED Notes (Signed)
Upon Bringing the Patient to the Examination Room, the Patient stated that he would like to leave and go to [facility] Emergency Department. He states that when [facility] initially responded to Patients 911 Call, he stated to the Responders that he would like to have been brought to [facility] Emergency Department but was instead brought to [employer] at [facility] Emergency Department. Patient made aware that this RN cannot facilitate the Patient requests to be taken to another Emergency Department but that this RN can facilitate transportation for the Patient to be transported Home or to another Location. Patient became agitated and stated "Imma walk over to [facility]" and proceeded to leave the Department. [facility] made aware of Patient Situation. MD Madilyn Hook made aware of the patients departure from the facility.

## 2021-09-28 LAB — SALICYLATE LEVEL: Salicylate Lvl: <7 mg/dL — ABNORMAL LOW (ref 7.0–30.0)

## 2021-10-28 IMAGING — CT CT MAXILLOFACIAL W/ CM
3 series · 16 of 47 positions shown, 19 images · IV contrast (omnipaque)
Comparison: None.

CLINICAL DATA: Odontogenic abscess

EXAM:
CT MAXILLOFACIAL WITH CONTRAST
TECHNIQUE: Multidetector CT imaging of the maxillofacial structures was
performed with intravenous contrast. Multiplanar CT image
reconstructions were also generated.
CONTRAST:  75mL OMNIPAQUE IOHEXOL 300 MG/ML  SOLN

[Series 3: max soft · axial · 0.42mm/px · z∈[-176,-12]mm · 10 of 96 slices shown, 13 images]
[im 7/96  brain]
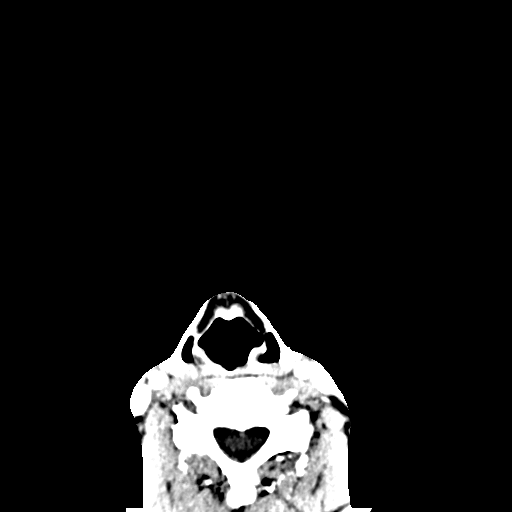
[im 7/96  bone]
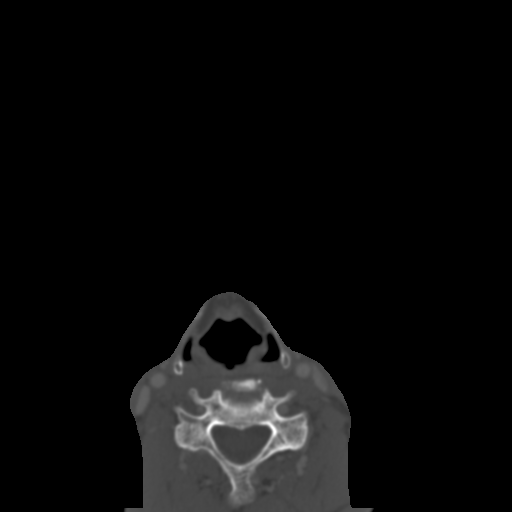
[im 17/96  bone]
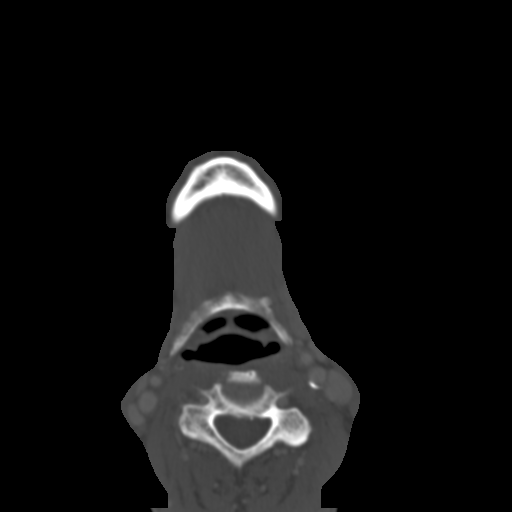
[im 27/96  bone]
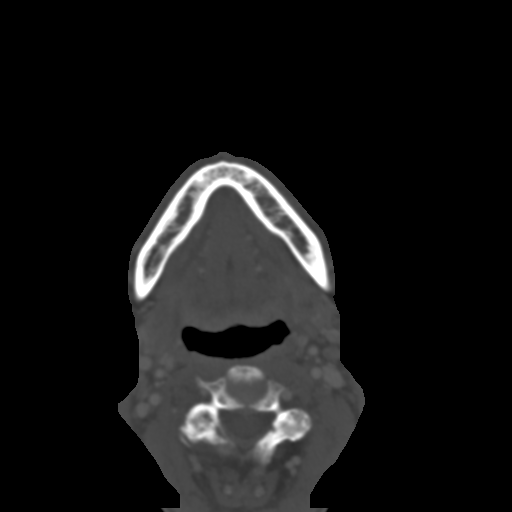
[im 33/96  bone]
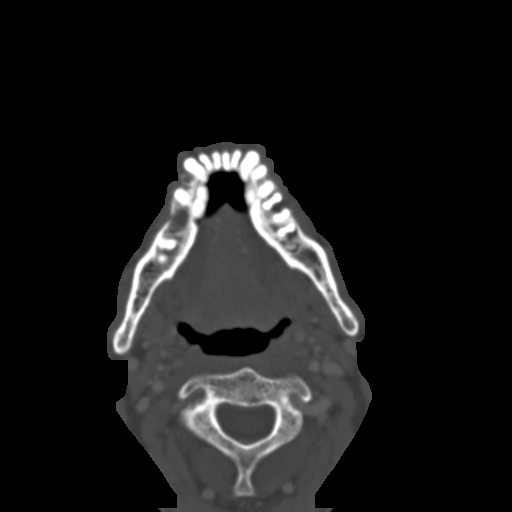
[im 43/96  brain]
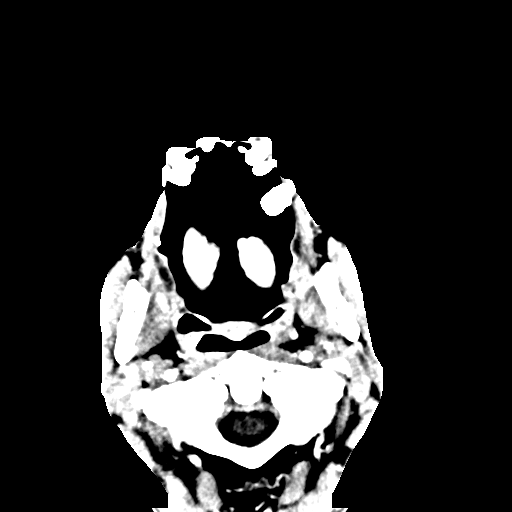
[im 43/96  bone]
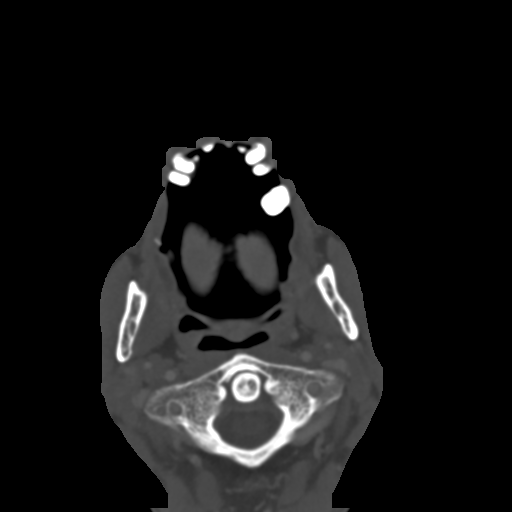
[im 53/96  bone]
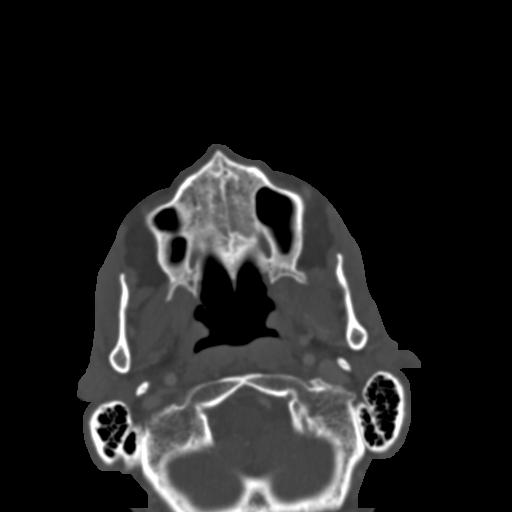
[im 63/96  bone]
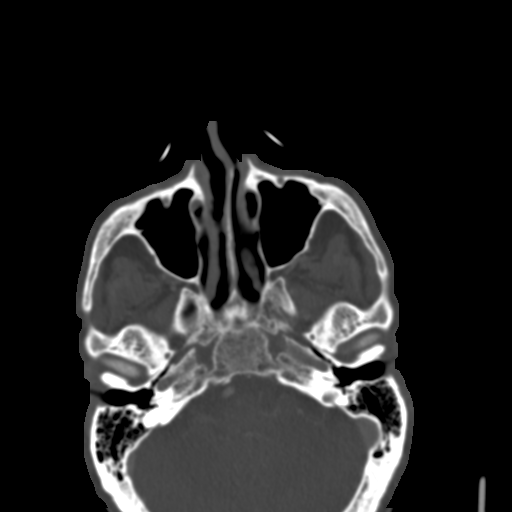
[im 73/96  bone]
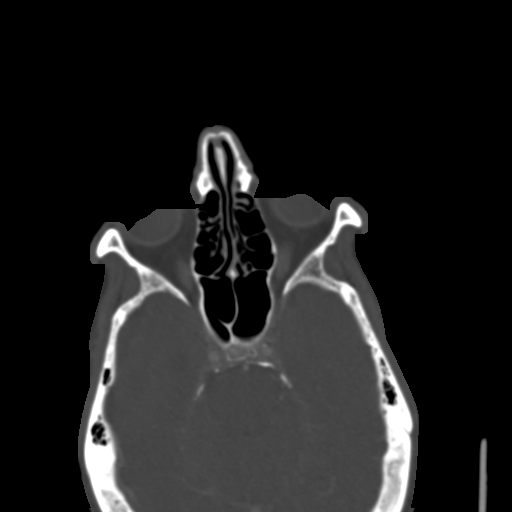
[im 79/96  brain]
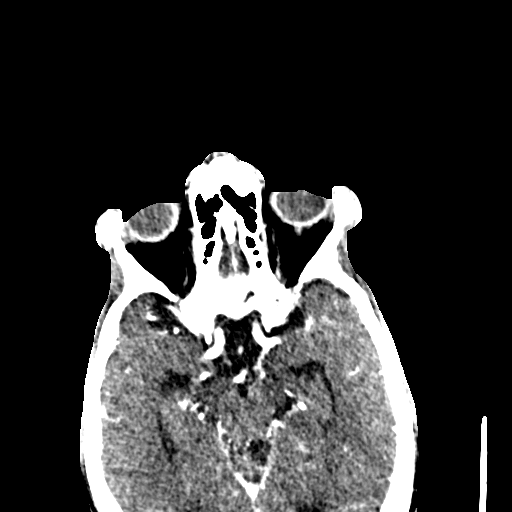
[im 79/96  bone]
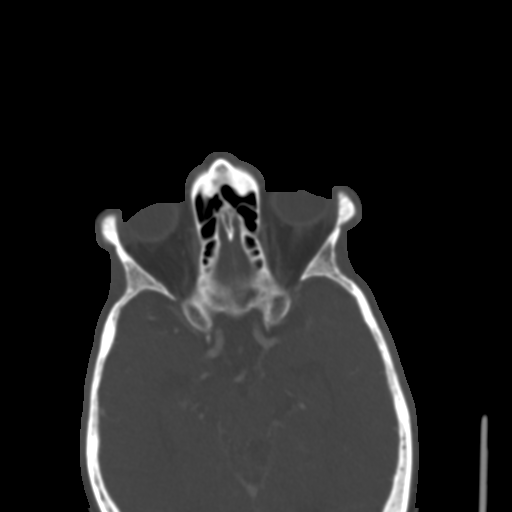
[im 89/96  bone]
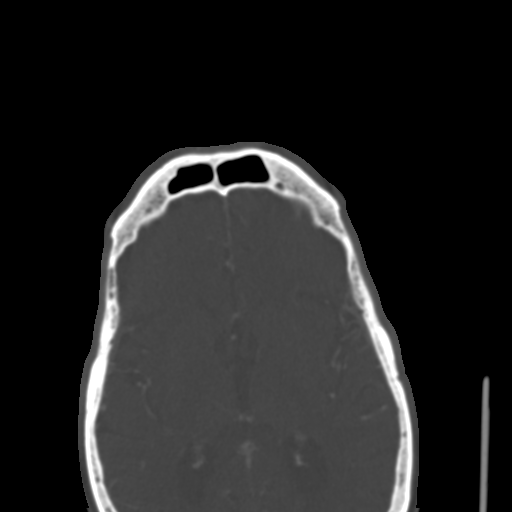

[Series 5: coronal soft · coronal · 0.36mm/px · 3 of 93 slices shown]
[im 31/93  bone]
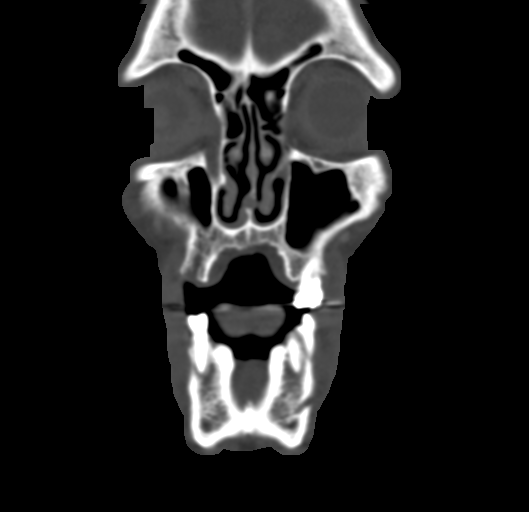
[im 41/93  bone]
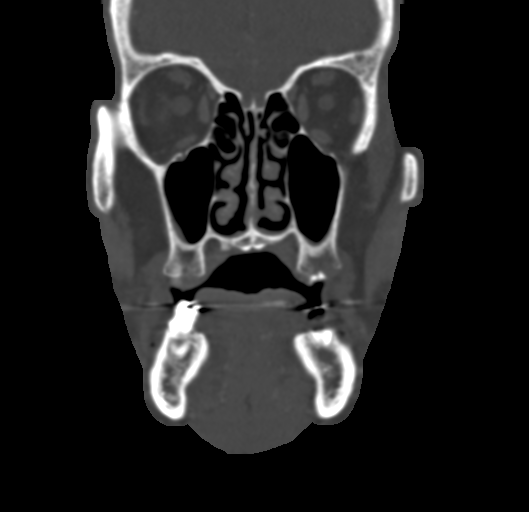
[im 52/93  bone]
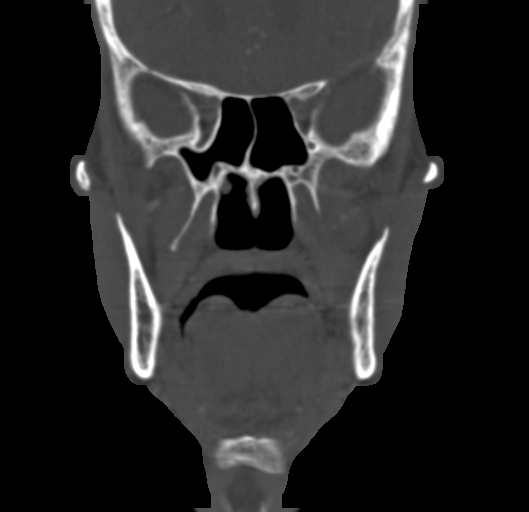

[Series 6: sagittal soft · sagittal · 0.36mm/px · 3 of 92 slices shown]
[im 31/92  bone]
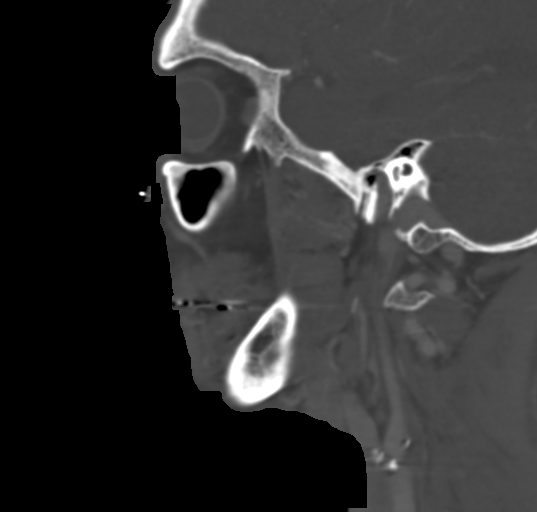
[im 46/92  bone]
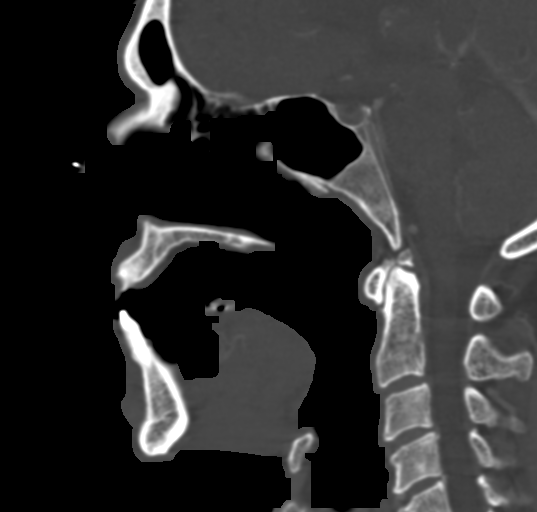
[im 61/92  bone]
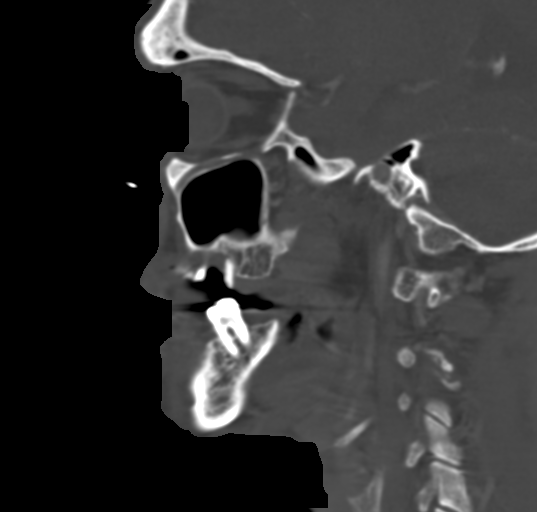

[16 of 47 positions shown; findings below may reference images not displayed]

FINDINGS: Osseous: Diffuse dental disease with multiple dental caries and
periapical lucencies there is no abscess or fluid collection. Mild
soft tissue swelling adjacent to teeth 17 and 18.

Orbits: Negative. No traumatic or inflammatory finding.

Sinuses: Clear.

Soft tissues: Negative.

Limited intracranial: No significant or unexpected finding.
IMPRESSION: Diffuse dental disease with multiple dental caries and periapical
lucencies. No abscess or fluid collection.

## 2022-05-04 IMAGING — CR DG CHEST 2V
2 series · 2 of 2 positions shown · non-contrast
Comparison: 09/11/2021

CLINICAL DATA: Chest pain

EXAM:
CHEST - 2 VIEW

[chest pa]
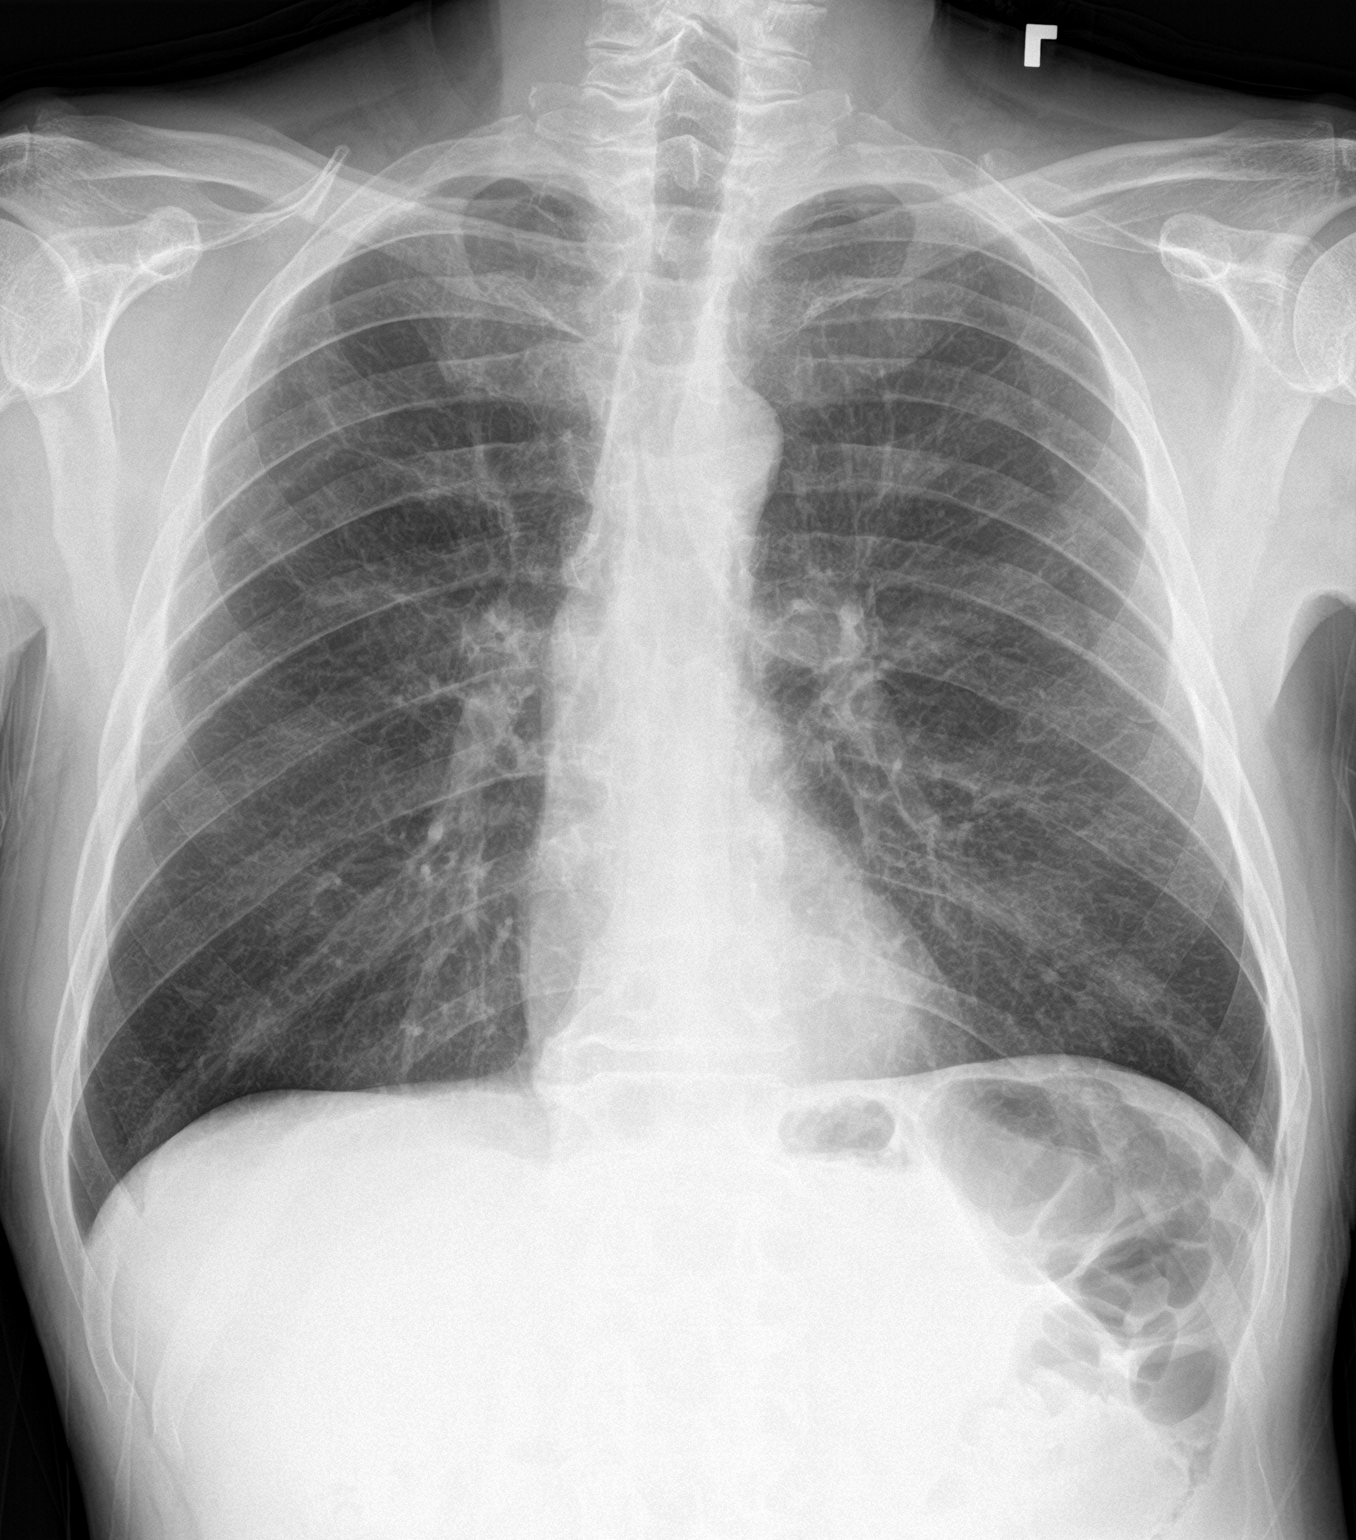

[chest lat]
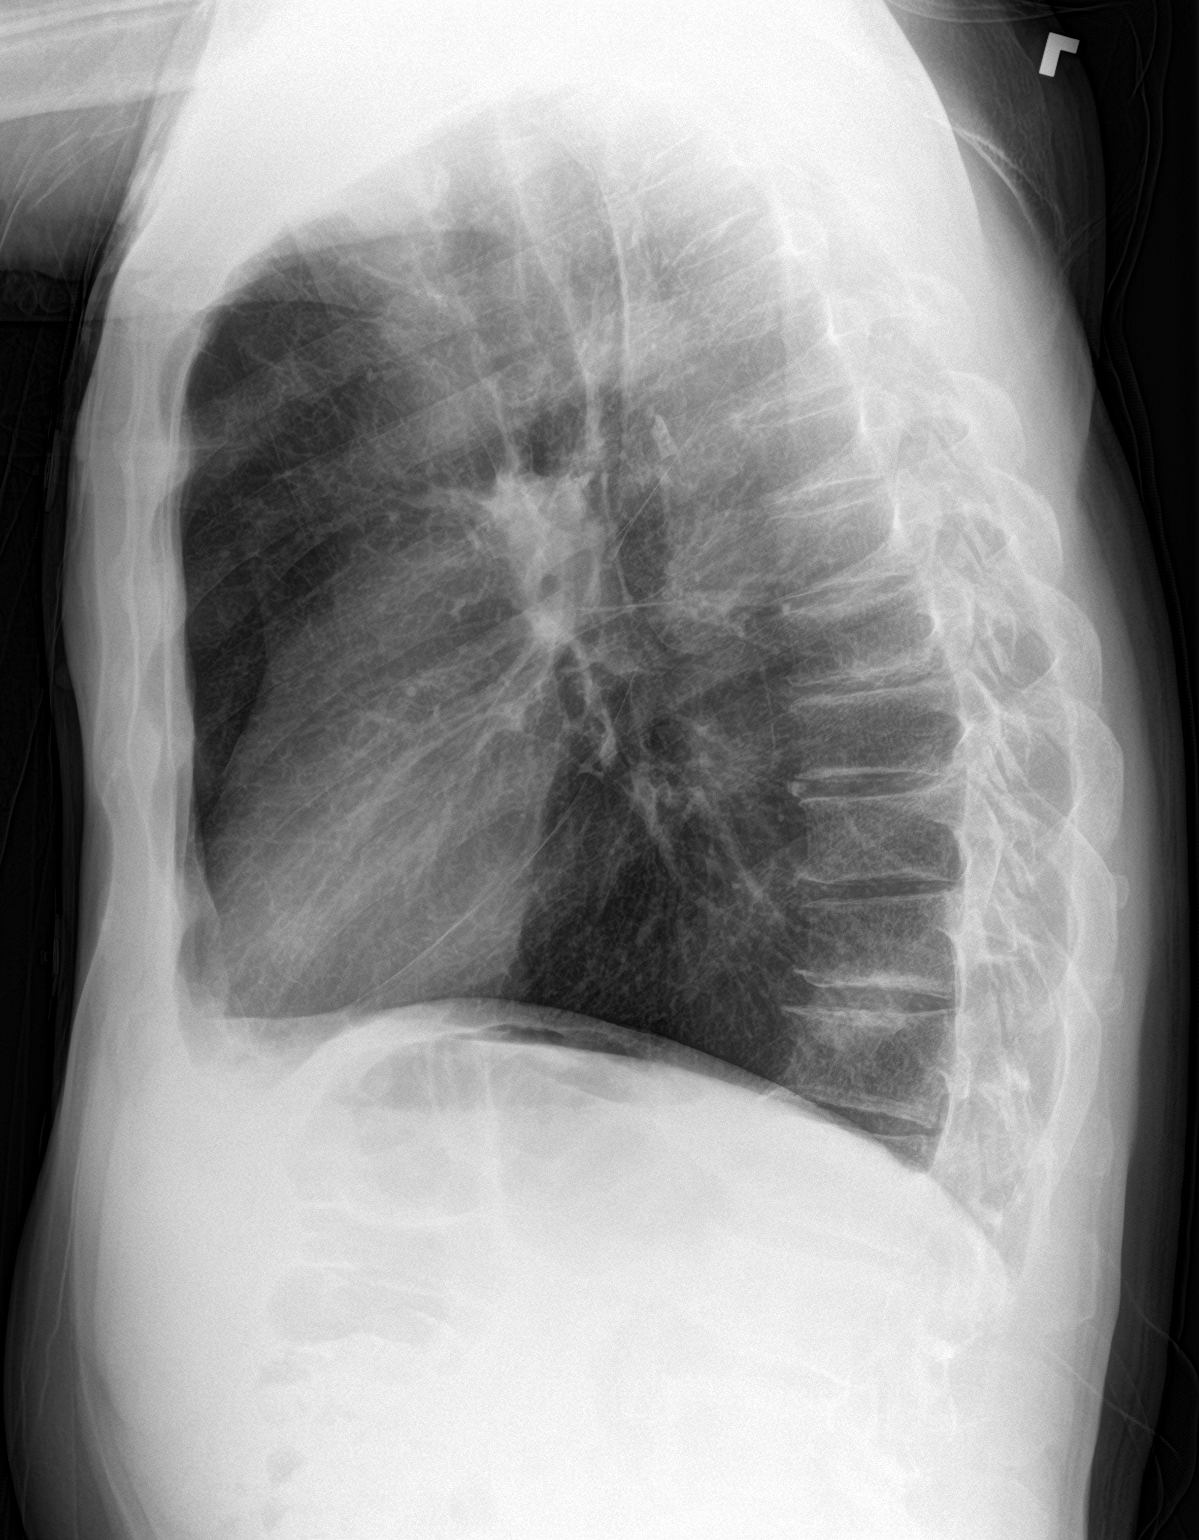

[2 of 2 positions shown; findings below may reference images not displayed]

FINDINGS: The heart size and mediastinal contours are within normal limits.
Both lungs are clear. The visualized skeletal structures are
unremarkable.
IMPRESSION: No active cardiopulmonary disease.
